# Patient Record
Sex: Male | Born: 1980 | Race: White | Hispanic: No | Smoking: Current every day smoker
Health system: Southern US, Community
[De-identification: ages and names within clinical notes are randomized; demographics above are authoritative.]

## PROBLEM LIST (undated history)

## (undated) DIAGNOSIS — F17200 Nicotine dependence, unspecified, uncomplicated: Secondary | ICD-10-CM

## (undated) DIAGNOSIS — M25579 Pain in unspecified ankle and joints of unspecified foot: Secondary | ICD-10-CM

## (undated) HISTORY — DX: Nicotine dependence, unspecified, uncomplicated: F17.200

## (undated) HISTORY — PX: JOINT REPLACEMENT: SHX530

## (undated) HISTORY — DX: Pain in unspecified ankle and joints of unspecified foot: M25.579

---
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
## 2007-07-18 HISTORY — PX: OTHER SURGICAL HISTORY: SHX169

## 2007-07-24 ENCOUNTER — Encounter (INDEPENDENT_AMBULATORY_CARE_PROVIDER_SITE_OTHER): Payer: Self-pay | Admitting: Orthopedic Surgery

## 2007-07-24 ENCOUNTER — Ambulatory Visit (HOSPITAL_BASED_OUTPATIENT_CLINIC_OR_DEPARTMENT_OTHER): Admission: RE | Admit: 2007-07-24 | Discharge: 2007-07-24 | Payer: Self-pay | Admitting: Orthopedic Surgery

## 2007-10-23 ENCOUNTER — Ambulatory Visit: Payer: Self-pay | Admitting: Family Medicine

## 2007-11-03 ENCOUNTER — Telehealth: Payer: Self-pay | Admitting: Family Medicine

## 2008-07-12 ENCOUNTER — Ambulatory Visit: Payer: Self-pay | Admitting: Family Medicine

## 2008-07-12 DIAGNOSIS — R634 Abnormal weight loss: Secondary | ICD-10-CM | POA: Insufficient documentation

## 2008-07-12 DIAGNOSIS — R51 Headache: Secondary | ICD-10-CM | POA: Insufficient documentation

## 2008-07-12 DIAGNOSIS — R519 Headache, unspecified: Secondary | ICD-10-CM | POA: Insufficient documentation

## 2009-04-26 ENCOUNTER — Ambulatory Visit: Payer: Self-pay | Admitting: Family Medicine

## 2009-04-26 DIAGNOSIS — R079 Chest pain, unspecified: Secondary | ICD-10-CM | POA: Insufficient documentation

## 2009-04-26 LAB — CONVERTED CEMR LAB
Basophils Relative: 0 % (ref 0–1)
Blood in Urine, dipstick: NEGATIVE
Eosinophils Absolute: 0.1 10*3/uL (ref 0.0–0.7)
Eosinophils Relative: 1 % (ref 0–5)
Glucose, Urine, Semiquant: NEGATIVE
HCT: 44.3 % (ref 39.0–52.0)
Lymphs Abs: 1.6 10*3/uL (ref 0.7–4.0)
MCHC: 35.4 g/dL (ref 30.0–36.0)
MCV: 90.5 fL (ref 78.0–100.0)
Monocytes Relative: 7 % (ref 3–12)
Nitrite: NEGATIVE
Platelets: 171 10*3/uL (ref 150–400)
Specific Gravity, Urine: 1.015
WBC Urine, dipstick: NEGATIVE
WBC: 11.6 10*3/uL — ABNORMAL HIGH (ref 4.0–10.5)
pH: 7

## 2009-07-14 ENCOUNTER — Ambulatory Visit: Payer: Self-pay | Admitting: Family Medicine

## 2010-02-23 ENCOUNTER — Ambulatory Visit: Payer: Self-pay | Admitting: Family Medicine

## 2010-07-26 ENCOUNTER — Ambulatory Visit: Payer: Self-pay | Admitting: Family Medicine

## 2010-07-26 DIAGNOSIS — H6120 Impacted cerumen, unspecified ear: Secondary | ICD-10-CM | POA: Insufficient documentation

## 2010-07-26 DIAGNOSIS — M6740 Ganglion, unspecified site: Secondary | ICD-10-CM | POA: Insufficient documentation

## 2010-07-26 DIAGNOSIS — I498 Other specified cardiac arrhythmias: Secondary | ICD-10-CM | POA: Insufficient documentation

## 2010-09-04 ENCOUNTER — Ambulatory Visit: Payer: Self-pay | Admitting: Emergency Medicine

## 2010-09-04 DIAGNOSIS — M25579 Pain in unspecified ankle and joints of unspecified foot: Secondary | ICD-10-CM | POA: Insufficient documentation

## 2010-09-04 HISTORY — DX: Pain in unspecified ankle and joints of unspecified foot: M25.579

## 2010-12-06 ENCOUNTER — Ambulatory Visit: Payer: Self-pay | Admitting: Emergency Medicine

## 2011-01-17 NOTE — Assessment & Plan Note (Signed)
Summary: RIGHT ANKLE PAIN   Vital Signs:  Patient Profile:   30 Years Old Male CC:      right ankle pain post injury last night Height:     74 inches Weight:      180 pounds O2 Sat:      99 % O2 treatment:    Room Air Temp:     97.6 degrees F oral Pulse rate:   86 / minute Resp:     14 per minute BP sitting:   116 / 78  (left arm) Cuff size:   large  Pt. in pain?   yes    Location:   right ankle    Intensity:   8    Type:       sharp/throbbing  Vitals Entered By: Lajean Saver RN (September 04, 2010 10:38 AM)                   Updated Prior Medication List: No Medications Current Allergies (reviewed today): No known allergies History of Present Illness History from: patient Chief Complaint: right ankle pain post injury last night History of Present Illness: R ankle pain after rolling it last night.  He was playing with his children.  Was able to walk afterwards.  Today hurts worse, mild swelling, pain is constant dull.  Not using OTC meds. Has a history of R ankle pain from a few years ago and "ligament damage" from a similar injury.  REVIEW OF SYSTEMS Constitutional Symptoms      Denies fever, chills, night sweats, weight loss, weight gain, and fatigue.  Eyes       Denies change in vision, eye pain, eye discharge, glasses, contact lenses, and eye surgery. Ear/Nose/Throat/Mouth       Denies hearing loss/aids, change in hearing, ear pain, ear discharge, dizziness, frequent runny nose, frequent nose bleeds, sinus problems, sore throat, hoarseness, and tooth pain or bleeding.  Respiratory       Denies dry cough, productive cough, wheezing, shortness of breath, asthma, bronchitis, and emphysema/COPD.  Cardiovascular       Denies murmurs, chest pain, and tires easily with exhertion.    Gastrointestinal       Denies stomach pain, nausea/vomiting, diarrhea, constipation, blood in bowel movements, and indigestion. Genitourniary       Denies painful urination, kidney  stones, and loss of urinary control. Neurological       Denies paralysis, seizures, and fainting/blackouts. Musculoskeletal       Complains of muscle pain, joint pain, joint stiffness, decreased range of motion, and swelling.      Denies redness, muscle weakness, and gout.      Comments: right ankle Skin       Denies bruising, unusual mles/lumps or sores, and hair/skin or nail changes.  Psych       Denies mood changes, temper/anger issues, anxiety/stress, speech problems, depression, and sleep problems. Other Comments: Patient was chasing his kids yesterday and rolled his right ankle hearing a "pop". He c/o sharp shooting painsin his right ankle and up his right leg    Past History:  Past Medical History: Reviewed history from 10/23/2007 and no changes required. None  Past Surgical History: Reviewed history from 02/23/2010 and no changes required. Right knee bone spurs 05/1999 Lef hand 8/08  Family History: Reviewed history from 10/23/2007 and no changes required. GF with Lung Ca GM with DM Father with HTN  Social History: Reviewed history from 02/23/2010 and no changes required.  Married to [employer] with 3 children.  Current Smoker-1 ppd x12  Drug use-no Regular exercise-yes Alcohol use-no Physical Exam General appearance: well developed, well nourished, no acute distress Extremities: R ankle: +TTP ATFL, mildly CFL.  No TTP med/lat mall, nav, base of 5, calc, Achiles. Mild swelling laterally, no ecchymoses Neurological: grossly intact and non-focal Skin: no obvious rashes or lesions MSE: oriented to time, place, and person Assessment New Problems: ANKLE PAIN, RIGHT (ICD-719.47)  XRay: lateral talar avulsion fracture   Patient Education: Patient and/or caregiver instructed in the following: rest, Tylenol prn, Ibuprofen prn.  Plan New Orders: Est. Patient Level III [99213] T-DG Ankle Complete*R* [73610] Ambulatory Surgical Boot ea [L3260] Planning  Comments:   Rest, ice, elevation Walking boot to be worn. Follow up with sports medicine or orthopedics next week.  Ph# given.   The patient and/or caregiver has been counseled thoroughly with regard to medications prescribed including dosage, schedule, interactions, rationale for use, and possible side effects and they verbalize understanding.  Diagnoses and expected course of recovery discussed and will return if not improved as expected or if the condition worsens. Patient and/or caregiver verbalized understanding.   Orders Added: 1)  Est. Patient Level III [56213] 2)  T-DG Ankle Complete*R* [73610] 3)  Ambulatory Surgical Boot ea [L3260]  Appended Document: RIGHT ANKLE PAIN Patient reports that the boot makes his ankle feel worse and that OTC Ibuprofen isn't enough for the pain.  He was advised to bring the boot back, continue NWB with the crutches, and I will write a Rx for Vicodin #20.  He is still to follow up with sports medicine as scheduled next week.  Appended Document: RIGHT ANKLE PAIN Actually the patient will keep the boot because it was only uncomfortable at night.  I told him to remove it when he goes to bed or if sitting down for a prolonged period of time.  Otherwise, he is doing well.

## 2011-01-17 NOTE — Letter (Signed)
Summary: REFERRAL TO ORTHOPEDIC  REFERRAL TO ORTHOPEDIC   Imported By: Dannette Barbara 09/04/2010 12:03:44  _____________________________________________________________________  External Attachment:    Type:   Image     Comment:   External Document

## 2011-01-17 NOTE — Letter (Signed)
Summary: Out of Work  [facility]  [address]   [place], [place] [address]   Phone: [phone]  Fax: [phone]    September 04, 2010   Employee:  Raymond Stewart [employer]    To Whom It May Concern:   For Medical reasons, please excuse the above named employee from work for the following dates:  Sept 19, 2011   If you need additional information, please feel free to contact our office.         Sincerely,    Hoyt Koch MD

## 2011-01-17 NOTE — Letter (Signed)
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   [place], [place] [address]   Phone: [phone]  Fax: [phone]    Today's Date: September 04, 2010  Name of Patient: Raymond Stewart [facility]  The above named patient had a medical visit today at: 11 am.  Please take this into consideration when reviewing the time away from work/school.    Special Instructions:  [  ] None  [  ] To be off the remainder of today, returning to the normal work / school schedule tomorrow.  [  ] To be off until the next scheduled appointment on ______________________.  [ X ] Other: Saud to be out of work today and should be allowed to do mostly seated work for the rest of the week.  Afterwards, he may return to normal duty, however he will need to wear his walking boot.  He will be seeing a sports medicine doctor next week who may change the restrictions at that time.   Sincerely yours,   Hoyt Koch MD

## 2011-01-17 NOTE — Assessment & Plan Note (Signed)
Summary: Lt hand has a knot, Rt ear issue, bradycardia   Vital Signs:  Patient profile:   30 year old male Height:      74 inches Weight:      180 pounds BMI:     23.19 Pulse rate:   77 / minute BP sitting:   121 / 63  (left arm) Cuff size:   regular  Vitals Entered By: Avon Gully CMA, Duncan Dull) (July 26, 2010 9:43 AM) CC: Knot on left hand x one week, sometimes hurts and pain radiates to fingers, cant hear out of rt ear x 3 months,no pain,heart beats slower when laying down   Primary Care Provider:  Linford Arnold, C  CC:  Knot on left hand x one week, sometimes hurts and pain radiates to fingers, cant hear out of rt ear x 3 months, no pain, and heart beats slower when laying down.  History of Present Illness: Knot on left hand x one week, sometimes hurts and pain radiates to fingers, cant hear out of rt ear x 3 months,no pain,heart beats slower when laying down.  Knot on the back of left hand.  Can feel a discomfort occassionally. It has not changed but noticed it about a week ago.    Right ear with hearing loss for 3 month. 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 It was gradual.  No pain or drainage. No problems with the left ear. Not sue if any infection when it started.   Usually when goes to bed and lays on his back says his heart feels really heavy and slow.  He will try to take deep breaths to speed his heart back up. Lasts maybe 1-2 min.  Occ scares him. No chest pain or nausea with it. + family hx heart dz, but not premature.  hasn't checked pulse when it happens. Happens occ but has been more frequent. Now about once a week. Doesn't happen with naps. Does do shift work.  No problem with swellibg in the extremities.   Current Medications (verified): 1)  None  Allergies (verified): No Known Drug Allergies  Comments:  Nurse/Medical Assistant: The patient's medications and allergies were reviewed with the patient and were updated in the Medication  and Allergy Lists. Avon Gully CMA, Duncan Dull) (July 26, 2010 9:45 AM)  Physical Exam  General:  Well-developed,well-nourished,in no acute distress; alert,appropriate and cooperative throughout examination Head:  Normocephalic and atraumatic without obvious abnormalities. No apparent alopecia or balding. Eyes:  No corneal or conjunctival inflammation noted. EOMI. Perrla.  Ears:  Both ears bocked by cerumen.  TM and canal clear after irrigation.  Nose:  External nasal examination shows no deformity or inflammation. Nasal mucosa are pink and moist without lesions or exudates. Mouth:  Oral mucosa and oropharynx without lesions or exudates.  Teeth in good repair. Lungs:  Normal respiratory effort, chest expands symmetrically. Lungs are clear to auscultation, no crackles or wheezes. Heart:  Normal rate and regular rhythm. S1 and S2 normal without gallop, murmur, click, rub or other extra sounds. Abdomen:  Bowel sounds positive,abdomen soft and non-tender without masses, organomegaly or hernias noted. Msk:  posterior left hand with a small 0.2-0.3 palpable cyst. Nontender.   Extremities:  No LE edema.  Skin:  no rashes.   Psych:  Cognition and judgment appear intact. Alert and cooperative with normal attention span and concentration. No apparent delusions, illusions, hallucinations   Impression & Recommendations:  Problem # 1:  CERUMEN IMPACTION (ICD-380.4) Irrigated and pt  could instantly hear. Repeat ear exam was normal.    Problem # 2:  BRADYCARDIA (ICD-427.89)  EKG shows rate of 65 bpm, no acute changes.No evid of heart block.  Recomend set up for holter monitor.  Discussed options including getting a Holter monitor to try to capture the events. It does happen about once a week.   Can also consider referral to cardiology for further evaluation. He prefers cards referral.   Orders: EKG w/ Interpretation (93000)  Problem # 3:  GANGLION CYST (ICD-727.43) GAve reassuracne that these  are benign. If becomes tender and painful can refer to hand surgeon for removal.  If get larger then we can try to aspirate it for treatment.   Patient Instructions: 1)  We will call you with the cardiology referral.

## 2011-01-17 NOTE — Letter (Signed)
Summary: MED PRESCRIPTION PLOICY  MED PRESCRIPTION PLOICY   Imported By: Dannette Barbara 09/04/2010 12:02:54  _____________________________________________________________________  External Attachment:    Type:   Image     Comment:   External Document

## 2011-01-17 NOTE — Assessment & Plan Note (Signed)
Summary: PAIN IN RECTAL AREA/TJ   Vital Signs:  Patient Profile:   30 Years Old Male CC:      intermittent sharp rectal pain after intercourse Height:     74 inches Weight:      173 pounds O2 Sat:      99 % O2 treatment:    Room Air Temp:     97.2 degrees F oral Pulse rate:   75 / minute Pulse rhythm:   regular Resp:     18 per minute BP sitting:   111 / 71  (right arm)  Pt. in pain?   yes    Location:   rectum    Intensity:   10    Type:       sharp  Vitals Entered By: Lajean Saver RN (February 23, 2010 9:43 AM)                   Updated Prior Medication List: No Medications Current Allergies (reviewed today): No known allergies History of Present Illness Chief Complaint: intermittent sharp rectal pain after intercourse History of Present Illness: PATIENT STATES THIS HAS HAPPENED INTERMITTANTLY OVER THE PAST 2-3 MONTH. DOES NOT INGAGE IN ANAL SEX OF ANY SORT. NO SWELLING. PAIN LAST APPROX 15 MIN AND RESOLVES. DENIES URETHRAL DISCHARGE. NO HX OF PRIOR PROBLMES. MARRIED. NO DYSURIA.   REVIEW OF SYSTEMS Constitutional Symptoms      Denies fever, chills, night sweats, weight loss, weight gain, and fatigue.  Eyes       Denies change in vision, eye pain, eye discharge, glasses, contact lenses, and eye surgery. Ear/Nose/Throat/Mouth       Denies hearing loss/aids, change in hearing, ear pain, ear discharge, dizziness, frequent runny nose, frequent nose bleeds, sinus problems, sore throat, hoarseness, and tooth pain or bleeding.  Respiratory       Denies dry cough, productive cough, wheezing, shortness of breath, asthma, bronchitis, and emphysema/COPD.  Cardiovascular       Denies murmurs, chest pain, and tires easily with exhertion.    Gastrointestinal       Denies stomach pain, nausea/vomiting, diarrhea, constipation, blood in bowel movements, and indigestion.      Comments: rectal pain Genitourniary       Denies painful urination, kidney stones, and loss of urinary  control. Neurological       Denies paralysis, seizures, and fainting/blackouts. Musculoskeletal       Denies muscle pain, joint pain, joint stiffness, decreased range of motion, redness, swelling, muscle weakness, and gout.  Skin       Denies bruising, unusual mles/lumps or sores, and hair/skin or nail changes.  Psych       Denies mood changes, temper/anger issues, anxiety/stress, speech problems, depression, and sleep problems. Other Comments: occurs after intercourse, chills accompany pain, and sometimes dizziness,  patientdenies bleeding, and denies rectal intercourse   Past History:  Past Medical History: Reviewed history from 10/23/2007 and no changes required. None  Past Surgical History: Right knee bone spurs 05/1999 Lef hand 8/08  Family History: Reviewed history from 10/23/2007 and no changes required. GF with Lung Ca GM with DM Father with HTN  Social History:  Married to [employer] with 3 children.  Current Smoker-1 ppd x12  Drug use-no Regular exercise-yes Alcohol use-no Physical Exam General appearance: well developed, well nourished, no acute distress GU: normal, NO DISCHARGE, RECTAL REVEALS NO SWELLING OR HEMORRHOIDAL TISSUE. PROSTATE ENLARGED WARM AND TENDER. ABLE TO REPRODUCE PAIN WITH PROSTATE PALPATION.  Assessment New Problems:  ACUTE PROSTATITIS (ICD-601.0)   Plan New Medications/Changes: CIPRO 500 MG TABS (CIPROFLOXACIN HCL) 1 by mouth two times a day  ##30 x 0, 02/23/2010, Marvis Moeller DO  New Orders: Est. Patient Level III [16109]   Prescriptions: CIPRO 500 MG TABS (CIPROFLOXACIN HCL) 1 by mouth two times a day  ##30 x 0   Entered and Authorized by:   Marvis Moeller DO   Signed by:   Marvis Moeller DO on 02/23/2010   Method used:   Print then Give to Patient   RxID:   [id number]   Patient Instructions: 1)  AVOID CAFFEINE. C0MPLETE ALL OF MEDICATON. IF PROBLEM PERSISTS OR RECURS RECOMMEND UROLOGY EVAL.

## 2011-01-18 NOTE — Assessment & Plan Note (Signed)
Summary: RT ANKLE NUMBNESS,TINGLING/WSE   Vital Signs:  Patient Profile:   30 Years Old Male CC:      PT not seen referred to orthopedic for an appt/cl,lpn O2 treatment:    Room Air (left arm) Cuff size:   regular  Vitals Entered By: Clemens Catholic LPN (December 06, 2010 12:07 PM)                  Current Allergies: No known allergies History of Present Illness Chief Complaint: ****PT not seen referred to orthopedic for an appt/cl,lpn*** History of Present Illness: Patient not seen, referred back to his orthopedic surgeon tomorrow.  Has a history 3 months ago for being seen for left foot talar avulsion fx.  Now with continued pain.  No new injury.      The patient and/or caregiver has been counseled thoroughly with regard to medications prescribed including dosage, schedule, interactions, rationale for use, and possible side effects and they verbalize understanding.  Diagnoses and expected course of recovery discussed and will return if not improved as expected or if the condition worsens. Patient and/or caregiver verbalized understanding.   Orders Added: 1)  No Charge Patient Arrived (NCPA0) [NCPA0]

## 2011-04-04 ENCOUNTER — Encounter: Payer: Self-pay | Admitting: Family Medicine

## 2011-04-06 ENCOUNTER — Encounter: Payer: Self-pay | Admitting: Family Medicine

## 2011-04-06 ENCOUNTER — Ambulatory Visit (INDEPENDENT_AMBULATORY_CARE_PROVIDER_SITE_OTHER): Payer: Private Health Insurance - Indemnity | Admitting: Family Medicine

## 2011-04-06 VITALS — BP 127/80 | HR 70 | Ht 74.0 in | Wt 185.0 lb

## 2011-04-06 DIAGNOSIS — R41840 Attention and concentration deficit: Secondary | ICD-10-CM

## 2011-04-06 DIAGNOSIS — J329 Chronic sinusitis, unspecified: Secondary | ICD-10-CM

## 2011-04-06 DIAGNOSIS — R413 Other amnesia: Secondary | ICD-10-CM

## 2011-04-06 DIAGNOSIS — R21 Rash and other nonspecific skin eruption: Secondary | ICD-10-CM

## 2011-04-06 MED ORDER — SELENIUM SULFIDE 2.5 % EX LOTN: TOPICAL_LOTION | CUTANEOUS | Status: DC

## 2011-04-06 MED ORDER — AMOXICILLIN-POT CLAVULANATE 875-125 MG PO TABS: 1.0000 | ORAL_TABLET | Freq: Two times a day (BID) | ORAL | Status: AC

## 2011-04-06 NOTE — Patient Instructions (Signed)
We will call you with the psychiatry referral Call if you sinuses are not better in 10 days.

## 2011-04-06 NOTE — Progress Notes (Signed)
Subjective:    Patient ID: Raymond Stewart, male    DOB: 1981-11-10, 30 y.o.   MRN: [id number]  Sinusitis This is a recurrent problem. The current episode started 1 to 4 weeks ago (10 days). The problem has been gradually worsening since onset. There has been no fever. Associated symptoms include congestion, coughing and sneezing. Pertinent negatives include no ear pain, headaches, shortness of breath, sinus pressure, sore throat or swollen glands. (Bloody nasal discharge. ) Past treatments include oral decongestants. The treatment provided mild relief.  Rash This is a chronic problem. The current episode started more than 1 year ago. The problem has been gradually worsening since onset. Pain location: belly button. The rash is characterized by dryness (no itching but has been getting larger). He was exposed to nothing. Associated symptoms include congestion and coughing. Pertinent negatives include no shortness of breath or sore throat. Past treatments include nothing. There is no history of asthma or eczema.   Wife feels she has an attention problem. Not completed tasks. Easily distracted.  Also having some mood swings, has been irritable.  Happening at work and home. 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  Wife is really concerned Sometimes will get anxious.  Feels restless.  Never been tested for ADHD.  Wakes up frequently with sleep.  Sleep is very irregularly.    He felt like he was a normal kid. Didn't feel he had difficulty focusing        Review of Systems  HENT: Positive for congestion and sneezing. Negative for ear pain, sore throat and sinus pressure.   Respiratory: Positive for cough. Negative for shortness of breath.   Skin: Positive for rash.  Neurological: Negative for headaches.       Objective:   Physical Exam  Constitutional: He is oriented to person, place, and time. He appears well-developed and well-nourished.  HENT:  Head: Normocephalic and atraumatic.  Right Ear:  External ear normal.  Left Ear: External ear normal.  Nose: Nose normal.  Mouth/Throat: Oropharynx is clear and moist.  Eyes: Conjunctivae and EOM are normal. Pupils are equal, round, and reactive to light.  Neck: Normal range of motion. Neck supple. No thyromegaly present.  Cardiovascular: Normal rate, regular rhythm and normal heart sounds.   Pulmonary/Chest: Effort normal and breath sounds normal.  Lymphadenopathy:    He has no cervical adenopathy.  Neurological: He is alert and oriented to person, place, and time.  Skin: Skin is warm and dry. Rash noted.       On his left buttock has pink oval macules that have A fine scale.  Also similar but much larger area over the umbilicus. Scarping performed to rule out fungus.           Assessment & Plan:  Rash - Likely tinea versicolor I did do a scraping for KOH on the lesion on his abdomen. The area on his buttocks appears to be extremely consistent with tinea versicolor. Head and center of her prescription for some Lamisil 5 lotion to use. I will also call him with results and KOH once we have that back in a couple of days.  Sinusitis - I will go ahead and treat him as bacterial sinusitis. Please complete the antibiotic. Symptomatic care is appropriate. Call if not better in 10 days.  Mood/inattention. - Me this is not a clear-cut case of ADD or ADHD. Certainly he has sleep issues and he works a swing set shift which is certainly can contribute to inability  to focus and mood changes. I like him to see psychiatry for further evaluation. Patient agrees to care plan.

## 2011-04-08 ENCOUNTER — Telehealth: Payer: Self-pay | Admitting: Family Medicine

## 2011-04-08 LAB — KOH PREP

## 2011-04-08 NOTE — Telephone Encounter (Signed)
The scraping is fungal. Thus likely Tinea versicolor. Use the selenium shampoo and let me know if not better in one month.

## 2011-04-09 NOTE — Telephone Encounter (Signed)
Pt notified via vm

## 2011-05-01 NOTE — Op Note (Signed)
Raymond Stewart, Raymond Stewart             ACCOUNT NO.:  [id number]   MEDICAL RECORD NO.:  [id number]          PATIENT TYPE:  AMB   LOCATION:  DSC                          FACILITY:  [facility]   PHYSICIAN:  Katy Fitch. Sypher, M.D. DATE OF BIRTH:  28-Feb-1981   DATE OF PROCEDURE:  07/24/2007  DATE OF DISCHARGE:                               OPERATIVE REPORT   PREOPERATIVE DIAGNOSIS:  Chronic left long finger flexor tenosynovitis.   POSTOPERATIVE DIAGNOSIS:  Chronic left long finger flexor tenosynovitis,  with identification of chronic inflammatory tenosynovitis involving the  superficialis and profundus tendons from proximal to the A1 pulley in  the palm distal to the A3 pulley.   POSTOPERATIVE DIAGNOSIS:  Rule out mycobacterial infection versus  foreign body synovitis.   OPERATION:  Radical tenosynovectomy of flexor digitorum superficialis  and profundus tendons, left long finger, with specimens of tenosynovium  sent for aerobic and anaerobic growth, acid-fast and fungal smear and  culture, and routine histopathology.   OPERATING SURGEON:  Katy Fitch. Sypher, MD   ASSISTANT:  Annye Rusk PA-C.   ANESTHESIA:  General by LMA, supervising anesthesiologist is Janetta Hora.  Gelene Mink, MD.   INDICATIONS:  Raymond Stewart is an 30 year old gentleman referred for  evaluation of chronic flexor tenosynovitis involving his left palm and  long finger.   He had had a chronic pain and inability to flex the finger for more than  6 weeks.   Clinical examination revealed a convex bulging of the palm proximal to  the A1 pulley suggestive of an aggressive synovitis and a flexion  contracture of the left long finger due to loss of excursion of the  superficialis and profundus tendons.   Raymond Stewart was sent for an MRI of his hand due to the degree of  swelling to be certain that this did not represent a tumor that was  infiltrating the flexor sheath.  The MRI evaluation suggested a chronic  synovitis.   Arrangements were made for him to come to the operating room  at this time anticipating tenosynovectomy for culture, biopsy, acid-fast  and fungal smear, and culture.   After informed consent,  he is brought to the operating room.   Raymond Stewart was brought to the operating room and placed in a supine  position upon the operating table.   Following induction of general anesthesia by LMA technique, the left arm  was prepped with Betadine soap and solution and sterilely draped.  A  pneumatic tourniquet was applied to the proximal left brachium.   Following exsanguination of the left arm with an Esmarch bandage, the  arterial tourniquet was inflated to 220 mmHg.   A Brunner zigzag incision was planned with a webspace dart.   The flexor sheath was exposed and noted be markedly inflammatory with  fibrin and inflammatory granulation tissue extruding through the flexor  sheath between the A1 pulley and the A3 pulley.   The preputial fold proximal to the A1 pulley was thoroughly infiltrated  with inflammatory tenosynovium.  This was excised completely and passed  off for routine pathology as well as aerobic and anaerobic culture.  A window was created between the C1 pulley and the A3 pulley and the  flexor tendons were delivered distally.  The superficialis and profundus  tendons were stripped clean of all pathologic tenosynovium.   There was a significant amount of tenosynovium posterior to the  profundus tendon deep to the A2 pulley that was removed with a micro  rongeur.   The flexor tendons were then delivered proximal to the A1 pulley and  between the A1 pulley and the A2 pulley for thorough tenosynovectomy.   After complete clearing of all pathologic-appearing synovium from the  superficialis and profundus tendons, specimens were sent for aerobic and  anaerobic growth in the proper culture media tubes, acid-fast and fungal  smear and culture in saline in a sterile cup, and  some synovium was  placed directly into formalin for histopathologic evaluation.   This had the appearance of a foreign body reaction verses a chronic  mycobacterial infection on a gross clinical basis.   The wounds were then irrigated and repaired with corner sutures of 5-0  nylon and intradermal 3-0 Prolene in the longitudinal limbs of the  Brunner incision.   Raymond Stewart was placed in a compressive dressing with a volar plaster  splint maintaining the wrist in 30 degrees of dorsiflexion.   We will initiate gentle active range of motion excises immediately.   There were no apparent complications.      Katy Fitch Sypher, M.D.  Electronically Signed     RVS/MEDQ  D:  07/24/2007  T:  07/25/2007  Job:  [id number]

## 2011-08-22 ENCOUNTER — Inpatient Hospital Stay (INDEPENDENT_AMBULATORY_CARE_PROVIDER_SITE_OTHER)
Admission: RE | Admit: 2011-08-22 | Discharge: 2011-08-22 | Disposition: A | Discharge status: Home / Self Care | Payer: Private Health Insurance - Indemnity | Source: Ambulatory Visit | Attending: Emergency Medicine | Admitting: Emergency Medicine

## 2011-08-22 ENCOUNTER — Encounter: Payer: Self-pay | Admitting: Emergency Medicine

## 2011-08-22 DIAGNOSIS — M25559 Pain in unspecified hip: Secondary | ICD-10-CM

## 2011-08-23 ENCOUNTER — Telehealth (INDEPENDENT_AMBULATORY_CARE_PROVIDER_SITE_OTHER): Payer: Self-pay | Admitting: *Deleted

## 2011-08-24 ENCOUNTER — Telehealth (INDEPENDENT_AMBULATORY_CARE_PROVIDER_SITE_OTHER): Payer: Self-pay | Admitting: Emergency Medicine

## 2011-10-01 LAB — TISSUE CULTURE
Culture: NO GROWTH
Gram Stain: NONE SEEN

## 2011-10-01 LAB — POCT HEMOGLOBIN-HEMACUE
Hemoglobin: 15.9
Operator id: 112821

## 2011-10-01 LAB — ANAEROBIC CULTURE

## 2011-10-01 LAB — FUNGUS CULTURE W SMEAR

## 2011-10-01 LAB — AFB CULTURE WITH SMEAR (NOT AT ARMC)

## 2011-11-19 NOTE — Progress Notes (Signed)
Summary: left side leg pain   Vital Signs:  Patient Profile:   30 Years Old Male CC:      LT leg pain x 1 1/2 wk Height:     74 inches Weight:      191 pounds O2 Sat:      99 % O2 treatment:    Room Air Temp:     98.3 degrees F oral Pulse rate:   67 / minute Resp:     20 per minute BP sitting:   119 / 69  (left arm) Cuff size:   regular  Vitals Entered By: Clemens Catholic LPN (August 22, 2011 11:54 AM)                  Updated Prior Medication List: No Medications Current Allergies (reviewed today): No known allergies History of Present Illness Chief Complaint: LT leg pain x 1 1/2 wk History of Present Illness: L leg / buttock pain for 10 days.  No trauma.  He has a h/o trochanteric bursitis and a possible herniated disc.  This pain is different because it's in his buttock. Hurts worse while rising from seated or seated for a prolonged period of time.  Not using any meds or modalities.  Trauma: no Bladder/bowel incontinence: no Weakness: no Fever/chills: no Night pain: no Unexplained weight loss: no Cancer/immunosuppression: no PMH of osteoporosis or chronic steroid use:  no  REVIEW OF SYSTEMS Constitutional Symptoms      Denies fever, chills, night sweats, weight loss, weight gain, and fatigue.  Eyes       Denies change in vision, eye pain, eye discharge, glasses, contact lenses, and eye surgery. Ear/Nose/Throat/Mouth       Denies hearing loss/aids, change in hearing, ear pain, ear discharge, dizziness, frequent runny nose, frequent nose bleeds, sinus problems, sore throat, hoarseness, and tooth pain or bleeding.  Respiratory       Denies dry cough, productive cough, wheezing, shortness of breath, asthma, bronchitis, and emphysema/COPD.  Cardiovascular       Denies murmurs, chest pain, and tires easily with exhertion.    Gastrointestinal       Denies stomach pain, nausea/vomiting, diarrhea, constipation, blood in bowel movements, and  indigestion. Genitourniary       Denies painful urination, kidney stones, and loss of urinary control. Neurological       Denies paralysis, seizures, and fainting/blackouts. Musculoskeletal       Complains of muscle pain and joint pain.      Denies joint stiffness, decreased range of motion, redness, swelling, muscle weakness, and gout.  Skin       Denies bruising, unusual mles/lumps or sores, and hair/skin or nail changes.  Psych       Denies mood changes, temper/anger issues, anxiety/stress, speech problems, depression, and sleep problems. Other Comments: pt c/o LT hip/leg pain x 1 1/2 wk. no injury. no OTC meds. he has a hx of bursitis in his LT hip.   Past History:  Past Medical History: Reviewed history from 10/23/2007 and no changes required. None  Past Surgical History: Reviewed history from 02/23/2010 and no changes required. Right knee bone spurs 05/1999 Lef hand 8/08  Family History: Reviewed history from 10/23/2007 and no changes required. GF with Lung Ca GM with DM Father with HTN  Social History: Reviewed history from 02/23/2010 and no changes required.  Married to [employer] with 3 children.  Current Smoker-1 ppd x12  Drug use-no Regular exercise-yes Alcohol use-no Physical Exam General  appearance: well developed, well nourished, no acute distress MSE: oriented to time, place, and person Minimal L greater trochanter tenderness. SLR and knee flexion causes pain in buttock.  Standing on R leg reproduces pain.  No back pain or tenderness.  Log roll normal.  FROM of hip and knee. Assessment New Problems: HIP PAIN (ICD-719.45)   Plan New Medications/Changes: PREDNISONE (PAK) 10 MG TABS (PREDNISONE) 6 day pack, use as directed  #1 x 0, 08/22/2011, Hoyt Koch MD TRAMADOL HCL 50 MG TABS (TRAMADOL HCL) 1 by mouth q6 hrs as needed for pain  #24 x 0, 08/22/2011, Hoyt Koch MD  New Orders: Est. Patient Level III [phone] Planning Comments:    LIkely is piriformis syndrome or similar variant.  DDx includes trochanteric bursitis radiation pain, pain from herniated disc, glut medius strain.  Pred taper and Tramadol given.  Heating pad. Stretches described.  If not improving, will send him back to [place] vs straight to PT.  He will call if worsening or not improving.   The patient and/or caregiver has been counseled thoroughly with regard to medications prescribed including dosage, schedule, interactions, rationale for use, and possible side effects and they verbalize understanding.  Diagnoses and expected course of recovery discussed and will return if not improved as expected or if the condition worsens. Patient and/or caregiver verbalized understanding.  Prescriptions: PREDNISONE (PAK) 10 MG TABS (PREDNISONE) 6 day pack, use as directed  #1 x 0   Entered and Authorized by:   Hoyt Koch MD   Signed by:   Hoyt Koch MD on 08/22/2011   Method used:   Print then Give to Patient   RxID:   [id number] TRAMADOL HCL 50 MG TABS (TRAMADOL HCL) 1 by mouth q6 hrs as needed for pain  #24 x 0   Entered and Authorized by:   Hoyt Koch MD   Signed by:   Hoyt Koch MD on 08/22/2011   Method used:   Print then Give to Patient   RxID:   [id number]   Orders Added: 1)  Est. Patient Level III [29528]

## 2011-11-19 NOTE — Telephone Encounter (Signed)
  Phone Note Call from Patient   Caller: Patient Summary of Call: pt called and reports that his leg pain is no better, meds not helping. sch'ed appt with dr Margaretha Sheffield today @ 2pm in [place]. pt notified and records sent to [facility]. Initial call taken by: Clemens Catholic LPN,  August 23, 2011 11:09 AM

## 2011-11-19 NOTE — Telephone Encounter (Signed)
  Phone Note Call from Patient Call back at Home Phone [phone] [facility]     Caller: Patient Call For: Dr. Orson Aloe Summary of Call: Pt. states not getting relief from Rx. Tramadol>>leads to nausea; has just purchased heating pad; cannot sleep at night; pain most intense when walking or lying down. Spoke with Dr.Henderson: call Rx for Vicodin 4/500 1 tab HS as needed x 6 with no refills. Pt. understands. Initial call taken by: Lavell Islam RN,  August 24, 2011 10:38 AM

## 2012-09-04 ENCOUNTER — Ambulatory Visit (INDEPENDENT_AMBULATORY_CARE_PROVIDER_SITE_OTHER): Payer: Private Health Insurance - Indemnity | Admitting: Sports Medicine

## 2012-09-04 ENCOUNTER — Encounter: Payer: Self-pay | Admitting: Sports Medicine

## 2012-09-04 VITALS — BP 128/76 | HR 87 | Wt 191.0 lb

## 2012-09-04 DIAGNOSIS — F17200 Nicotine dependence, unspecified, uncomplicated: Secondary | ICD-10-CM

## 2012-09-04 DIAGNOSIS — M25539 Pain in unspecified wrist: Secondary | ICD-10-CM

## 2012-09-04 DIAGNOSIS — M25531 Pain in right wrist: Secondary | ICD-10-CM | POA: Insufficient documentation

## 2012-09-04 HISTORY — DX: Nicotine dependence, unspecified, uncomplicated: F17.200

## 2012-09-04 MED ORDER — VARENICLINE TARTRATE 0.5 MG X 11 & 1 MG X 42 PO MISC: ORAL | Status: DC

## 2012-09-04 MED ORDER — MELOXICAM 15 MG PO TABS: ORAL_TABLET | ORAL | Status: DC

## 2012-09-04 NOTE — Assessment & Plan Note (Signed)
Symptoms are present mostly volar, and the lateral aspect of the carpal tunnel suggestive of a flexor tendinosis. He does get pain referred to the dorsal wrist joint with terminal flexion, and this is suggestive of the radiocarpal degenerative process. We will x-ray his wrist. Simple wrist brace. Mobic. Home rehabilitation exercises. We'll try this for 2-3 weeks, and if no better I can consider ultrasound versus ultrasound-guided injection at the next visit.

## 2012-09-04 NOTE — Assessment & Plan Note (Addendum)
Related to quit. We'll try Chantix, I prescribed this starting month up-taper pack. He may follow up with either his PCP or me further for continued smoking cessation, and for the second month smoking cessation pack of Chantix.

## 2012-09-04 NOTE — Progress Notes (Signed)
Subjective:    I'm seeing this patient as a consultation for:  Dr. Linford Arnold  CC: Right wrist pain, smoking cessation  HPI: Raymond Stewart is a very pleasant 31 year old male comes in with about a two-week history of pain he localizes on the volar aspect at the proximal wrist crease. He notes that the wrist feels somewhat weak, and he tends to drop things. The pain is worse with terminal flexion of the wrist, and does radiate through the proximal wrist crease to the dorsal wrist. He denies any numbness or tingling going into his fingertips, denies any nighttime symptoms.  He also denies any recent trauma. He does work in a job that requires repetitive wrist motion. He denies any swelling of the wrist.  Smoker: Raymond Stewart also desires to quit smoking. He's been smoking just over a pack to a pack and a half a day for a long time now. He's tried hypnosis, as well as quitting cold [place]. Neither of these have worked, and he's interested in pharmacologic intervention.  Past medical history, Surgical history, Family history, Social history, Allergies, and medications have been entered into the medical record, reviewed, and no changes needed.   Review of Systems: No headache, visual changes, nausea, vomiting, diarrhea, constipation, dizziness, abdominal pain, skin rash, fevers, chills, night sweats, weight loss, body aches, joint swelling, muscle aches, chest pain, or shortness of breath.   Objective:   Vitals:  Afebrile, vital signs stable. General: Well Developed, well nourished, and in no acute distress.  Neuro/Psych: Alert and oriented x3, extra-ocular muscles intact, able to move all 4 extremities.  Skin: Warm and dry, no rashes noted.  Respiratory: Not using accessory muscles, speaking in full sentences, trachea midline.  Cardiovascular: Pulses palpable, no extremity edema. Abdomen: Does not appear distended. Right Wrist: Inspection normal with no visible erythema or swelling. ROM smooth and normal with  good flexion and extension and ulnar/radial deviation that is symmetrical with opposite wrist. There is palpable pain deep within the carpal tunnel somewhat radial to the palmaris longus. I cannot reproduce this with resisted flexion, extension, radial, ulnar deviation of the wrist, nor pronation or supination with resistance. Resisted flexion, or extension of each digit individually also does not reproduce this pain. I am able to reproduce it with passive terminal flexion causing pain volarly that radiates through the wrist to the dorsal aspect. No snuffbox tenderness. No tenderness over Canal of Guyon. Strength 5/5 in all directions without pain. Negative Finkelstein, tinel's and phalens. Negative Watson's test. Sensation is grossly intact.   Impression and Recommendations:   This case required medical decision making of moderate complexity.

## 2012-09-23 ENCOUNTER — Ambulatory Visit: Payer: Private Health Insurance - Indemnity | Admitting: Sports Medicine

## 2012-09-23 DIAGNOSIS — Z0289 Encounter for other administrative examinations: Secondary | ICD-10-CM

## 2012-11-05 ENCOUNTER — Encounter: Payer: Self-pay | Admitting: Family Medicine

## 2012-11-05 ENCOUNTER — Ambulatory Visit (INDEPENDENT_AMBULATORY_CARE_PROVIDER_SITE_OTHER): Payer: Self-pay | Admitting: Family Medicine

## 2012-11-05 VITALS — BP 119/71 | HR 76 | Ht 74.0 in | Wt 191.0 lb

## 2012-11-05 DIAGNOSIS — R0789 Other chest pain: Secondary | ICD-10-CM

## 2012-11-05 DIAGNOSIS — J3489 Other specified disorders of nose and nasal sinuses: Secondary | ICD-10-CM

## 2012-11-05 DIAGNOSIS — Z8249 Family history of ischemic heart disease and other diseases of the circulatory system: Secondary | ICD-10-CM

## 2012-11-05 DIAGNOSIS — R0981 Nasal congestion: Secondary | ICD-10-CM

## 2012-11-05 LAB — COMPLETE METABOLIC PANEL WITH GFR
ALT: 9 U/L (ref 0–53)
AST: 15 U/L (ref 0–37)
Albumin: 4.4 g/dL (ref 3.5–5.2)
Alkaline Phosphatase: 87 U/L (ref 39–117)
BUN: 10 mg/dL (ref 6–23)
CO2: 29 mEq/L (ref 19–32)
Calcium: 9.4 mg/dL (ref 8.4–10.5)
Chloride: 103 mEq/L (ref 96–112)
Creat: 0.81 mg/dL (ref 0.50–1.35)
GFR, Est African American: >89 mL/min
GFR, Est Non African American: >89 mL/min
Glucose, Bld: 78 mg/dL (ref 70–99)
Potassium: 3.9 mEq/L (ref 3.5–5.3)
Sodium: 140 mEq/L (ref 135–145)
Total Bilirubin: 1.5 mg/dL — ABNORMAL HIGH (ref 0.3–1.2)
Total Protein: 6.7 g/dL (ref 6.0–8.3)

## 2012-11-05 LAB — CBC
HCT: 41.7 % (ref 39.0–52.0)
Hemoglobin: 14.6 g/dL (ref 13.0–17.0)
MCH: 30.4 pg (ref 26.0–34.0)
MCHC: 35 g/dL (ref 30.0–36.0)
MCV: 86.9 fL (ref 78.0–100.0)
Platelets: 223 10*3/uL (ref 150–400)
RBC: 4.8 MIL/uL (ref 4.22–5.81)
RDW: 13.8 % (ref 11.5–15.5)
WBC: 8.9 10*3/uL (ref 4.0–10.5)

## 2012-11-05 LAB — LIPID PANEL
Cholesterol: 158 mg/dL (ref 0–200)
Triglycerides: 88 mg/dL (ref <150)
VLDL: 18 mg/dL (ref 0–40)

## 2012-11-05 LAB — TSH: TSH: 1.316 u[IU]/mL (ref 0.350–4.500)

## 2012-11-05 MED ORDER — FLUTICASONE PROPIONATE 50 MCG/ACT NA SUSP: 2.0000 | Freq: Every day | NASAL | Status: DC

## 2012-11-05 MED ORDER — OMEPRAZOLE 20 MG PO CPDR: 20.0000 mg | DELAYED_RELEASE_CAPSULE | Freq: Every day | ORAL | Status: DC

## 2012-11-05 NOTE — Progress Notes (Signed)
  Subjective:    Patient ID: Raymond Stewart, male    DOB: 25-Jan-1981, 31 y.o.   MRN: [id number]  HPI Woke up at 11AM in the morning, about 2 weeks ago 10/25/12,  and had sharp chest pains and SOB. The night before had felt a little dizzy. Has had similar sxs before. Then went to ED, [facility] hospital) and told it was likely stress related. Says he didn't feel like he was under any particular stress.  Normally last 2-30 minutes but that one last about 3 hours.  Does work a rotating shift at work so sometimes eat irregularly.  No recent GERD or reflux sxs.  Still smoking but has cut back. Happened again on thrusday but was watching TV at the time. Lasted about an hour with chest pain and SOB.   Father was in early 38s when had his first heartattack.  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   Nasal pressure and congestion for several days. No runny nose. No itchin in the face. No URI sxs.  No fever.  Not on any allergy meds.     Review of Systems     Objective:   Physical Exam  Constitutional: He is oriented to person, place, and time. He appears well-developed and well-nourished.  HENT:  Head: Normocephalic and atraumatic.  Neck: Neck supple. No thyromegaly present.  Cardiovascular: Normal rate, regular rhythm and normal heart sounds.        No carotid or bruits.  Pulmonary/Chest: Effort normal and breath sounds normal.  Musculoskeletal: He exhibits no edema.  Lymphadenopathy:    He has no cervical adenopathy.  Neurological: He is alert and oriented to person, place, and time.  Skin: Skin is warm and dry.  Psychiatric: He has a normal mood and affect. His behavior is normal.          Assessment & Plan:  Atypical chest pain-this his description I do not think that this is cardiac because his pain has occurred at rest both times. There he does have a very strong family history of heart disease with his father having his first heart attack in his early 45s.  Certainly this is a risk factor. His blood pressure is well-controlled. His cholesterol is unknown so we will get that today to better risk stratify him. I think a treadmill stress test would be reasonable though. Also consider that this could be reflux or GERD related. He has not had any specific heartburn type symptoms but I would like to try PPI for the next 2-3 weeks to see if it makes a difference in his symptom control. He says he would try it. Also consider this could be more musculoskeletal but I would expect that his pain would be worse with activity, not at rest. Certainly this could be stress or anxiety related but right now I would like to rule out other causes.  Nasal congestion-we'll start with a nasal steroid spray. This will help the swelling and congestion. He does not get better then consider treating for sinusitis.  I did review his records from the hospital. His cardiac enzymes were normal. He also had a chest x-ray that was normal. EKG was reportedly normal as well. No anemia or electrolyte disturbance. He was told to take Tylenol and ibuprofen as needed.

## 2012-11-05 NOTE — Patient Instructions (Addendum)
We will call you with your lab results. If you don't here from us in about a week then please give us a call at [phone].  

## 2012-11-24 ENCOUNTER — Ambulatory Visit (INDEPENDENT_AMBULATORY_CARE_PROVIDER_SITE_OTHER): Payer: Private Health Insurance - Indemnity | Admitting: Family Medicine

## 2012-11-24 ENCOUNTER — Encounter: Payer: Self-pay | Admitting: Family Medicine

## 2012-11-24 ENCOUNTER — Ambulatory Visit (INDEPENDENT_AMBULATORY_CARE_PROVIDER_SITE_OTHER): Payer: Private Health Insurance - Indemnity

## 2012-11-24 VITALS — BP 133/83 | HR 90 | Ht 74.0 in | Wt 191.0 lb

## 2012-11-24 DIAGNOSIS — M25571 Pain in right ankle and joints of right foot: Secondary | ICD-10-CM

## 2012-11-24 DIAGNOSIS — S82839A Other fracture of upper and lower end of unspecified fibula, initial encounter for closed fracture: Secondary | ICD-10-CM

## 2012-11-24 DIAGNOSIS — S82899A Other fracture of unspecified lower leg, initial encounter for closed fracture: Secondary | ICD-10-CM

## 2012-11-24 DIAGNOSIS — M7989 Other specified soft tissue disorders: Secondary | ICD-10-CM

## 2012-11-24 DIAGNOSIS — X500XXA Overexertion from strenuous movement or load, initial encounter: Secondary | ICD-10-CM

## 2012-11-24 DIAGNOSIS — M25579 Pain in unspecified ankle and joints of unspecified foot: Secondary | ICD-10-CM

## 2012-11-24 MED ORDER — DICLOFENAC SODIUM 50 MG PO TBEC: DELAYED_RELEASE_TABLET | ORAL | Status: DC

## 2012-11-24 MED ORDER — HYDROCODONE-ACETAMINOPHEN 5-500 MG PO TABS: ORAL_TABLET | ORAL | Status: DC

## 2012-11-24 NOTE — Progress Notes (Signed)
CC: Raymond Stewart is a 31 y.o. male is here for right ankle injury   Subjective: HPI:  Patient reports right ankle pain moderate to severe in severity, acute onset when he had an inversion type injury while walking down stairs last night. Immediately had trouble with pain and walking however was able take more than 4 steps at that time. Initially localized to the lateral ankle however he's having some discomfort over the medial malleoli now as well. Reports a history of multiple sprained ankles in the past however this one feels more painful. He's been using crutches to help ambulate Interventions have included rest, ice, elevation. He is required to wear steel toed boots at work, be on his feet for 12 hours at a time, and operating machinery.  He denies coldness, numbness, or motor or sensory disturbances in the right lower extremity.   Review Of Systems Outlined In HPI  Past Medical History  Diagnosis Date  . Smoker 09/04/2012  . ANKLE PAIN, RIGHT 09/04/2010    Qualifier: Diagnosis of  By: Orson Aloe MD, Tinnie Gens       Family History  Problem Relation Age of Onset  . Hypertension Father   . Cancer Other     lung  . Diabetes Other      History  Substance Use Topics  . Smoking status: Current Every Day Smoker -- 0.5 packs/day for 12 years  . Smokeless tobacco: Not on file  . Alcohol Use: No     Objective: Filed Vitals:   11/24/12 0845  BP: 133/83  Pulse: 90    General: Alert and Oriented, No Acute Distress Extremities: Strong dorsalis pedis pulse, pain is localized with palpation of the anterior distal lateral malleoli, mild reproduction of discomfort with firm palpation of the distal anterior medial malleoli. Moderate swelling over the lateral malleoli without discoloration or redness. No pain over the navicular, no pain in the toe box, no pain with palpation of entire fifth metatarsal.  Full-strength of the toes. Pain is not reproduced with compression of the fibula and  tibial. Skin: Warm and dry.  Assessment & Plan: Raymond Stewart was seen today for right ankle injury.  Diagnoses and associated orders for this visit:  Right ankle pain - DG Ankle Complete Right; Future - HYDROcodone-acetaminophen (VICODIN) 5-500 MG per tablet; One to two tabs as needed at bedtime. - diclofenac (VOLTAREN) 50 MG EC tablet; Take one tablet every 8 hours only as needed for pain, take with small snack.    X-ray reveals avulsion fracture just distal to the distal fibula.  Patient has a cam walker at home and will wear this for the next one to 2 weeks, we went over range of motion and rehabilitation exercises that he can start once the swelling and pain improves. I would like him to return in 1-2 weeks at that time we'll put an Aircast splint on, reassess his pain and function, and we'll see if he can return to work wearing safety boots.  Return in about 2 weeks (around 12/08/2012).

## 2012-11-27 ENCOUNTER — Encounter: Payer: Private Health Insurance - Indemnity | Admitting: Physician Assistant

## 2012-11-27 ENCOUNTER — Ambulatory Visit: Payer: Private Health Insurance - Indemnity | Admitting: Family Medicine

## 2012-12-01 ENCOUNTER — Ambulatory Visit (INDEPENDENT_AMBULATORY_CARE_PROVIDER_SITE_OTHER): Payer: Private Health Insurance - Indemnity | Admitting: Family Medicine

## 2012-12-01 ENCOUNTER — Ambulatory Visit: Payer: Private Health Insurance - Indemnity | Admitting: Family Medicine

## 2012-12-01 ENCOUNTER — Encounter: Payer: Self-pay | Admitting: Family Medicine

## 2012-12-01 VITALS — BP 129/79 | HR 72 | Wt 189.0 lb

## 2012-12-01 DIAGNOSIS — S82899A Other fracture of unspecified lower leg, initial encounter for closed fracture: Secondary | ICD-10-CM

## 2012-12-01 DIAGNOSIS — S82839A Other fracture of upper and lower end of unspecified fibula, initial encounter for closed fracture: Secondary | ICD-10-CM

## 2012-12-01 MED ORDER — TRAMADOL HCL 50 MG PO TABS: 50.0000 mg | ORAL_TABLET | Freq: Four times a day (QID) | ORAL | Status: DC | PRN

## 2012-12-01 NOTE — Progress Notes (Signed)
CC: Raymond Stewart is a 31 y.o. male is here for 1 weeks f/u   Subjective: HPI:  Patient returns for right ankle pain in a setting of a closed avulsion fracture seen on plain films. He's been wearing his immobilizer on a daily basis almost 24 hours a day. Once to twice a day he then removing the immobilizer to do exercises with therapy and no resistance range of motion exercises. He believes he 70% better with respect to pain and mobility he tried putting on his steel toe boots but was too painful.  Vicodin is too strong, causes him to be sleepy and confused, he's requesting something less sedating.  His only complaints today is a well localized "pain" just distal to the lateral malleolus and is nonradiating. There is also some soreness just above the lateral malleolus. The above discomfort is worse with plantar flexion, absent at rest.  He denies fevers, chills, motor sensory disturbances in the right lower extremity, swelling, no bruising, nor any sensation of instability of the right ankle   Review Of Systems Outlined In HPI  Past Medical History  Diagnosis Date  . Smoker 09/04/2012  . ANKLE PAIN, RIGHT 09/04/2010    Qualifier: Diagnosis of  By: Orson Aloe MD, Tinnie Gens       Family History  Problem Relation Age of Onset  . Hypertension Father   . Cancer Other     lung  . Diabetes Other      History  Substance Use Topics  . Smoking status: Current Every Day Smoker -- 0.5 packs/day for 12 years  . Smokeless tobacco: Not on file  . Alcohol Use: No     Objective: Filed Vitals:   12/01/12 0823  BP: 129/79  Pulse: 72    General: Alert and Oriented, No Acute Distress Lungs: Comfortable work of breathing Cardiac: Regular rate and rhythm. Extremities: Trace right ankle edema.  Strong peripheral pulses.  Mild bruising just below the lateral malleolus the right ankle. Tender over ATF ligament, compression of fibula and tibia does not cause discomfort. Anterior drawer test reveals no  laxity. Skin: Warm and dry.  Assessment & Plan: Raymond Stewart was seen today for 1 weeks f/u.  Diagnoses and associated orders for this visit:  Closed avulsion fracture of distal fibula  Other Orders - traMADol (ULTRAM) 50 MG tablet; Take 1 tablet (50 mg total) by mouth every 6 (six) hours as needed for pain.    Fibula avulsion: Improving, I placed him in a air cast splint of the right ankle to be worn a daily basis, no longer use cam walker/immobilizer. Continue with home exercise plan on a daily basis. Return Monday for evaluation and possible release back to work.  Return in about 1 week (around 12/08/2012).

## 2012-12-02 ENCOUNTER — Telehealth: Payer: Self-pay | Admitting: *Deleted

## 2012-12-02 DIAGNOSIS — M25571 Pain in right ankle and joints of right foot: Secondary | ICD-10-CM

## 2012-12-02 NOTE — Telephone Encounter (Signed)
Sue Lush, Will you let him know that the medications I've prescribed for the ankle in increasing order of potentency is as follows. Ultram, Diclofenac, Vicodin.  I know that Vicodin was a little to strong for him, if he'd like to cut this in half along with diclofenac that would be reasonable and hopefully wouldn't cause too much sedation.  I'd be happy to send in refills if needed.

## 2012-12-02 NOTE — Telephone Encounter (Signed)
Pt called and and says the Ultram is not helping with the ankle pain.Please advise

## 2012-12-03 ENCOUNTER — Telehealth: Payer: Self-pay | Admitting: Family Medicine

## 2012-12-03 MED ORDER — DICLOFENAC SODIUM 50 MG PO TBEC: DELAYED_RELEASE_TABLET | ORAL | Status: DC

## 2012-12-03 MED ORDER — HYDROCODONE-ACETAMINOPHEN 5-500 MG PO TABS: ORAL_TABLET | ORAL | Status: DC

## 2012-12-03 NOTE — Telephone Encounter (Signed)
Both rx have been faxed to her pharm

## 2012-12-03 NOTE — Telephone Encounter (Signed)
Pt is ok with doing the Vicodin and Diclofenac and he will need refills

## 2012-12-03 NOTE — Telephone Encounter (Signed)
Left message on vm for pt to call me back

## 2012-12-03 NOTE — Telephone Encounter (Signed)
Patient walked-in advised he needs refills fpr Diclofenac and Vicodin today and please call him if you have any concerns. Thanks

## 2012-12-03 NOTE — Telephone Encounter (Signed)
Sue Lush, Diclofenac sent to [facility] on [address], can you please call in the vicodin rx placed in your inbox.

## 2012-12-05 ENCOUNTER — Telehealth: Payer: Self-pay | Admitting: *Deleted

## 2012-12-05 NOTE — Telephone Encounter (Signed)
Yes that would be appropriate.

## 2012-12-05 NOTE — Telephone Encounter (Signed)
Pt's insurance will not pay for the Vicodin until the 21st. Pt wants to know if he can take two of the Ultram for the pain until he can get the vicodin filled

## 2012-12-05 NOTE — Telephone Encounter (Signed)
Left message on vm

## 2012-12-08 ENCOUNTER — Encounter: Payer: Self-pay | Admitting: Family Medicine

## 2012-12-08 ENCOUNTER — Ambulatory Visit (INDEPENDENT_AMBULATORY_CARE_PROVIDER_SITE_OTHER): Payer: Private Health Insurance - Indemnity | Admitting: Family Medicine

## 2012-12-08 VITALS — BP 119/72 | HR 79 | Wt 188.0 lb

## 2012-12-08 DIAGNOSIS — S82839A Other fracture of upper and lower end of unspecified fibula, initial encounter for closed fracture: Secondary | ICD-10-CM

## 2012-12-08 DIAGNOSIS — S82899A Other fracture of unspecified lower leg, initial encounter for closed fracture: Secondary | ICD-10-CM

## 2012-12-08 NOTE — Progress Notes (Signed)
CC: Raymond Stewart is a 31 y.o. male is here for Foot Injury   Subjective: HPI:  Patient returns for followup of right closed avulsion fracture of the fibula. He thinks he is back to over 90% better. He reports full range of motion and strength in the right ankle. He's been doing theraband and  range of motion exercises daily. He has not needed pain medication the last 2 days. He denies new pain, swelling, bruising, nor skin discoloration. He denies motor or sensory disturbances in the right lower extremity. He would like to go back to work.   Review Of Systems Outlined In HPI  Past Medical History  Diagnosis Date  . Smoker 09/04/2012  . ANKLE PAIN, RIGHT 09/04/2010    Qualifier: Diagnosis of  By: Orson Aloe MD, Tinnie Gens       Family History  Problem Relation Age of Onset  . Hypertension Father   . Cancer Other     lung  . Diabetes Other      History  Substance Use Topics  . Smoking status: Current Every Day Smoker -- 0.5 packs/day for 12 years  . Smokeless tobacco: Not on file  . Alcohol Use: No     Objective: Filed Vitals:   12/08/12 0817  BP: 119/72  Pulse: 79    General: Alert and Oriented, No Acute Distress Extremities: No peripheral edema.  Strong peripheral pulses. Right ankle is without pain to palpation over the lateral malleolus, he has full range of motion strength throughout the right ankle and foot. Skin: Warm and dry.  Assessment & Plan: Javian was seen today for foot injury.  Diagnoses and associated orders for this visit:  Closed avulsion fracture of distal fibula    I've asked him to not wear the air splint for the next 24 hours and if he remains painless and there is no deterioration he can stop using the splint. Return to work on Thursday when his shift starts again. Continue range of motion and theraband exercises.  No charge visit as this is covered under [facility]  Return if symptoms worsen or fail to  improve.

## 2012-12-23 ENCOUNTER — Encounter: Payer: Private Health Insurance - Indemnity | Admitting: Physician Assistant

## 2013-01-07 ENCOUNTER — Encounter: Payer: Private Health Insurance - Indemnity | Admitting: Nurse Practitioner

## 2013-01-18 ENCOUNTER — Other Ambulatory Visit: Payer: Self-pay | Admitting: Family Medicine

## 2013-01-26 ENCOUNTER — Encounter: Payer: Self-pay | Admitting: Nurse Practitioner

## 2013-01-26 ENCOUNTER — Ambulatory Visit (INDEPENDENT_AMBULATORY_CARE_PROVIDER_SITE_OTHER): Payer: Private Health Insurance - Indemnity | Admitting: Nurse Practitioner

## 2013-01-26 DIAGNOSIS — R0789 Other chest pain: Secondary | ICD-10-CM

## 2013-01-26 DIAGNOSIS — Z8249 Family history of ischemic heart disease and other diseases of the circulatory system: Secondary | ICD-10-CM

## 2013-01-26 NOTE — Progress Notes (Signed)
Exercise Treadmill Test  Pre-Exercise Testing Evaluation Rhythm: normal sinus  Rate: 75                 Test  Exercise Tolerance Test Ordering MD: Nani Gasser  Interpreting MD: Norma Fredrickson, NP  Unique Test No: 1  Treadmill:  1  Indication for ETT: chest pain - rule out ischemia  Contraindication to ETT: No   Stress Modality: exercise - treadmill  Cardiac Imaging Performed: non   Protocol: standard Bruce - maximal  Max BP:  170/62  Max MPHR (bpm):  189 85% MPR (bpm):  161  MPHR obtained (bpm):  171 % MPHR obtained:  90%  Reached 85% MPHR (min:sec):  9:49 Total Exercise Time (min-sec):  10:30  Workload in METS:  12.4 Borg Scale: 18  Reason ETT Terminated:  patient's desire to stop    ST Segment Analysis At Rest: normal ST segments - no evidence of significant ST depression With Exercise: no evidence of significant ST depression  Other Information Arrhythmia:  No Angina during ETT:  absent (0) Quality of ETT:  diagnostic  ETT Interpretation:  normal - no evidence of ischemia by ST analysis  Comments: Patient presents today for routine GXT. Had atypical chest pain back in November. Then suffered broken ankle shortly thereafter. Has not had recurrent chest pain. Has a positive FH and is a smoker. Currently painfree.  Today he exercised on the standard Bruce protocol for a total of 10:30. He has good exercise tolerance. Adequate blood pressure response. Clinically negative for chest pain. Test was stopped due to fatigue. EKG negative. No arrhythmia.   Recommendations: CV risk factor modification. Smoking cessation.  Period stress testing.  Patient is agreeable to this plan and will call if any problems develop in the interim.

## 2013-04-08 ENCOUNTER — Encounter: Payer: Self-pay | Admitting: Physician Assistant

## 2013-04-08 ENCOUNTER — Ambulatory Visit (INDEPENDENT_AMBULATORY_CARE_PROVIDER_SITE_OTHER): Payer: Private Health Insurance - Indemnity | Admitting: Physician Assistant

## 2013-04-08 VITALS — BP 129/81 | HR 79 | Wt 187.0 lb

## 2013-04-08 DIAGNOSIS — F4323 Adjustment disorder with mixed anxiety and depressed mood: Secondary | ICD-10-CM

## 2013-04-08 DIAGNOSIS — F430 Acute stress reaction: Secondary | ICD-10-CM

## 2013-04-08 MED ORDER — FLUOXETINE HCL 10 MG PO TABS: 10.0000 mg | ORAL_TABLET | Freq: Every day | ORAL | Status: DC

## 2013-04-08 MED ORDER — ALPRAZOLAM 0.25 MG PO TABS: 0.2500 mg | ORAL_TABLET | Freq: Two times a day (BID) | ORAL | Status: DC | PRN

## 2013-04-08 NOTE — Patient Instructions (Signed)
Start Prozac at night as well as xanax as needed up to twice a day. Follow up in 6 weeks.

## 2013-04-08 NOTE — Progress Notes (Signed)
  Subjective:    Patient ID: Raymond Stewart, male    DOB: [birth year], 32 y.o.   MRN: [id number]  HPI Patient presents to the clinic with acute anxiety and depression. His grandfather died 2 weeks ago and his best friend has been missing for 6 days. All he can do is cry. He has no focus. His mind wanders all the time. He denies any feelings of suicide or harming others. She has never been on meds for anxiety or depression.       Review of Systems     Objective:   Physical Exam  Constitutional: He is oriented to person, place, and time. He appears well-developed and well-nourished.  HENT:  Head: Normocephalic and atraumatic.  Cardiovascular: Normal rate, regular rhythm and normal heart sounds.   Pulmonary/Chest: Effort normal and breath sounds normal.  Neurological: He is alert and oriented to person, place, and time.  Skin: Skin is warm and dry.  Psychiatric:  Uncontrollable crying.           Assessment & Plan:  Acute stress reaction/anxiety and depression- PHQ-9 was 17. Gave prozac to start at night and xanax to use as needed up to twice a day. Call if worsening or having bad thoughts. Talked about talking to someone and counseling. He denies at this time stating he has a lot of friends. Follow up in 4-6 weeks.    SPent 30 minutes with patient and greater than 50 percent of visit spent counseling patient regarding anxiety/depression/stress.

## 2013-05-19 DIAGNOSIS — N5082 Scrotal pain: Secondary | ICD-10-CM | POA: Insufficient documentation

## 2013-07-13 ENCOUNTER — Encounter: Payer: Self-pay | Admitting: Family Medicine

## 2013-07-13 ENCOUNTER — Ambulatory Visit (INDEPENDENT_AMBULATORY_CARE_PROVIDER_SITE_OTHER): Payer: 59 | Admitting: Family Medicine

## 2013-07-13 VITALS — BP 126/74 | HR 79 | Wt 193.0 lb

## 2013-07-13 DIAGNOSIS — S76219A Strain of adductor muscle, fascia and tendon of unspecified thigh, initial encounter: Secondary | ICD-10-CM

## 2013-07-13 DIAGNOSIS — IMO0002 Reserved for concepts with insufficient information to code with codable children: Secondary | ICD-10-CM

## 2013-07-13 DIAGNOSIS — M545 Low back pain, unspecified: Secondary | ICD-10-CM

## 2013-07-13 MED ORDER — DICLOFENAC SODIUM 75 MG PO TBEC: 75.0000 mg | DELAYED_RELEASE_TABLET | Freq: Two times a day (BID) | ORAL | Status: DC

## 2013-07-13 NOTE — Progress Notes (Signed)
Right sided back pain that radiates around to the groin. Subjective:    Patient ID: Raymond Stewart, male    DOB: Aug 31, 1981, 32 y.o.   MRN: [id number]  HPI Start with a right sided groin pain that radiated into the testicle a month ago. Worse with going up stairs and getting in the car.  [facility]ent to [facility] and told might had a strangulated hernia. Told to go to the ED.  Did a scan and told had a scan on the testicle. THen referred to Urology and reassured not cuasing his pain.   Mid-back pain that radiates around to the groin. Had spasms in his back last week.  Today feels better.  Says will lay on pillows.  No meds when this happens.  Right groin pain is some better but comes and goes.    Review of Systems  BP 126/74  Pulse 79  Wt 193 lb (87.544 kg)  BMI 24.77 kg/m2    No Known Allergies  Past Medical History  Diagnosis Date  . Smoker 09/04/2012  . ANKLE PAIN, RIGHT 09/04/2010    Qualifier: Diagnosis of  By: Orson Aloe MD, Tinnie Gens      Past Surgical History  Procedure Laterality Date  . Right knee bone spurs  6-00  . Left hand  8-08    History   Social History  . Marital Status: Married    Spouse Name: N/A    Number of Children: N/A  . Years of Education: N/A   Occupational History  . Not on file.   Social History Main Topics  . Smoking status: Current Every Day Smoker -- 0.50 packs/day for 12 years  . Smokeless tobacco: Not on file  . Alcohol Use: No  . Drug Use: No  . Sexually Active: Yes   Other Topics Concern  . Not on file   Social History Narrative  . No narrative on file    Family History  Problem Relation Age of Onset  . Hypertension Father   . Heart disease Father   . Heart attack Father   . Cancer Other     lung  . Diabetes Other     Outpatient Encounter Prescriptions as of 07/13/2013  Medication Sig Dispense Refill  . ALPRAZolam (XANAX) 0.25 MG tablet Take 1 tablet (0.25 mg total) by mouth 2 (two) times daily as needed for sleep.  30 tablet   0  . FLUoxetine (PROZAC) 10 MG tablet Take 1 tablet (10 mg total) by mouth daily.  30 tablet  1  . diclofenac (VOLTAREN) 75 MG EC tablet Take 1 tablet (75 mg total) by mouth 2 (two) times daily.  60 tablet  0   No facility-administered encounter medications on file as of 07/13/2013.          Objective:   Physical Exam  Constitutional: He is oriented to person, place, and time. He appears well-developed and well-nourished.  Musculoskeletal:  Nontender of the lumbar spine but he is mildly tender just to the right of the lumbar spine and paraspinous area. Negative tenderness over the SI joints. Hip, knee, ankle strength is 5 out of 5 bilaterally. Patellar reflexes are 2+ bilaterally. He does have some pain along the right groin with hip flexion against resistance. Normal lumbar flexion, extension, rotation right and left and side bending. He did have some pain in the right groin crease with external rotation of the hip.  Neurological: He is alert and oriented to person, place, and time.  Skin: Skin  is warm and dry.  Psychiatric: He has a normal mood and affect. His behavior is normal.          Assessment & Plan:  Back pain, right-sided-low back pain. He did have an MRI a couple years ago. Not sure the exact results. I did look in the chart and could not find a copy. We can always call Dr. Ruel Favors office to get a copy. I would like to get an x-ray today. I'm not sure if this is related to the right groin pain or not. I think at this point they may be 2 separate issues and will treat him as such. Recommend anti-inflammatory twice a day for at least a week. Can continue as needed after that. He should take with food and water and stop immediately if any GI upset or irritation. He is not currently taking any medications for his back. Recommend ice or heating pad whichever is more comfortable.  Right groin strain - given handout on right groin strain. Recommend anti-inflammatory as above. If not  improving over the next 2-3 weeks and please let me know and we'll refer for further evaluation and treatment.

## 2013-08-03 ENCOUNTER — Ambulatory Visit (INDEPENDENT_AMBULATORY_CARE_PROVIDER_SITE_OTHER): Payer: Private Health Insurance - Indemnity | Admitting: Physician Assistant

## 2013-08-03 ENCOUNTER — Ambulatory Visit (INDEPENDENT_AMBULATORY_CARE_PROVIDER_SITE_OTHER): Payer: 59

## 2013-08-03 ENCOUNTER — Ambulatory Visit: Payer: 59 | Admitting: Family Medicine

## 2013-08-03 ENCOUNTER — Encounter: Payer: Self-pay | Admitting: Physician Assistant

## 2013-08-03 VITALS — BP 118/72 | HR 77 | Ht 74.0 in | Wt 197.0 lb

## 2013-08-03 DIAGNOSIS — M545 Low back pain, unspecified: Secondary | ICD-10-CM

## 2013-08-03 DIAGNOSIS — M5442 Lumbago with sciatica, left side: Secondary | ICD-10-CM

## 2013-08-03 DIAGNOSIS — M5430 Sciatica, unspecified side: Secondary | ICD-10-CM

## 2013-08-03 DIAGNOSIS — IMO0002 Reserved for concepts with insufficient information to code with codable children: Secondary | ICD-10-CM

## 2013-08-03 MED ORDER — DICLOFENAC SODIUM 75 MG PO TBEC: 75.0000 mg | DELAYED_RELEASE_TABLET | Freq: Two times a day (BID) | ORAL | Status: DC

## 2013-08-03 MED ORDER — PREDNISONE 50 MG PO TABS: ORAL_TABLET | ORAL | Status: DC

## 2013-08-03 NOTE — Progress Notes (Signed)
  Subjective:    Patient ID: Raymond Stewart, male    DOB: 01/17/1981, 32 y.o.   MRN: [id number]  HPI Patient presents to the clinic following up of left sided low back pain into his buttocks and radiating down his left leg. Pain is not every day but when it is bad it is really bad. At its worst causes numbness into left foot. This has been and issue for the last 2 months. Dr. Linford Arnold gave muscle relaxers which pt stated didn't help and diclofenac which did help with symptoms. He has not had any imaging. Denies any trauma or history of back problems.      Review of Systems     Objective:   Physical Exam  Constitutional: He appears well-developed and well-nourished.  HENT:  Head: Normocephalic and atraumatic.  Musculoskeletal:  Pain with external ROM of the left hip. NO pain with palpation over lumbar spine or Paraspinous muscles. Some generalized tenderness over the left greater trochanteric bursa. Negative straight leg test today. Stiffness with side to side ROM. Patellar reflexes 2+. Strength 5/5 of bilateral legs.  No pain over SI joint.  Psychiatric: He has a normal mood and affect. His behavior is normal.          Assessment & Plan:  Left lower back with radiculopathy- Will get lumbar spine xrays today. Dr. Linford Arnold already had one ordered and accepted order but was ordered for right sided low back pain which was not mentioned today. Gave prednisone burst for 5 days. Continue home exercises. Continue on diclofenac. If lumbar spine are questionable may consider MRI of back. If normal may need to get imaging done of left hip.

## 2013-08-03 NOTE — Patient Instructions (Signed)
Continue on diclofenac. Start prednisone taper.  Continue home exercises. Will call with imaging results.

## 2013-08-10 ENCOUNTER — Encounter: Payer: Self-pay | Admitting: Family Medicine

## 2013-08-10 ENCOUNTER — Other Ambulatory Visit: Payer: Self-pay | Admitting: Family Medicine

## 2013-08-10 DIAGNOSIS — M25571 Pain in right ankle and joints of right foot: Secondary | ICD-10-CM

## 2013-08-11 MED ORDER — HYDROCODONE-ACETAMINOPHEN 5-500 MG PO TABS: ORAL_TABLET | ORAL | Status: DC

## 2013-08-24 ENCOUNTER — Ambulatory Visit: Payer: 59 | Admitting: Family Medicine

## 2013-08-24 DIAGNOSIS — Z0289 Encounter for other administrative examinations: Secondary | ICD-10-CM

## 2013-09-04 ENCOUNTER — Ambulatory Visit (INDEPENDENT_AMBULATORY_CARE_PROVIDER_SITE_OTHER): Payer: 59

## 2013-09-04 ENCOUNTER — Ambulatory Visit (INDEPENDENT_AMBULATORY_CARE_PROVIDER_SITE_OTHER): Payer: 59 | Admitting: Family Medicine

## 2013-09-04 ENCOUNTER — Encounter: Payer: Self-pay | Admitting: Family Medicine

## 2013-09-04 VITALS — BP 133/80 | HR 87 | Wt 197.0 lb

## 2013-09-04 DIAGNOSIS — M25559 Pain in unspecified hip: Secondary | ICD-10-CM

## 2013-09-04 DIAGNOSIS — R109 Unspecified abdominal pain: Secondary | ICD-10-CM

## 2013-09-04 DIAGNOSIS — M25551 Pain in right hip: Secondary | ICD-10-CM

## 2013-09-04 DIAGNOSIS — S76219S Strain of adductor muscle, fascia and tendon of unspecified thigh, sequela: Secondary | ICD-10-CM

## 2013-09-04 MED ORDER — MELOXICAM 7.5 MG PO TABS: 7.5000 mg | ORAL_TABLET | Freq: Every day | ORAL | Status: DC

## 2013-09-04 NOTE — Progress Notes (Signed)
  Subjective:    Patient ID: Raymond Stewart, male    DOB: June 24, 1981, 32 y.o.   MRN: [id number]  HPI Seen on 07/2013 and reported Patient presents to the clinic following up of left sided low back pain into his buttocks and radiating down his left leg. Pain is not every day but when it is bad it is really bad. At its worst causes numbness into left foot. This has been and issue for the last 2 months. Dr. Linford Arnold gave muscle relaxers which pt stated didn't help and diclofenac which did help with symptoms. He has not had any imaging. Denies any trauma or history of back problems.   Took the steroids,had a normla lumbar xray, and the left side is feeling better but then about 5 days ago started having shooting pain in the right groin crease. Felt like a strain every time took a step.  Feels a sharp shooting pain with walking.  Feels it when turns over in bed. His back acutally feels ok.  No fever, chills or sweats or rash. He had similar symptoms about 2 months ago when I had seen him after being sent to the emergency department by [facility] for possible hernia. He did not have a hernia. He says it's painful to swing his leg with walking. It's painful to extend his leg backward and to abduct his hip. He is not overweight and denies wearing any tightfitting clothing, bowel et Karie Soda.   Review of Systems     Objective:   Physical Exam  Constitutional: He appears well-developed and well-nourished.  HENT:  Head: Normocephalic and atraumatic.  Musculoskeletal:   Right hip with normal range of motion but he does have some discomfort with full extension and internal rotation of the hip and external rotation of the hip. He also has pain with swinging his leg across the opposite leg. Hip, knee, ankle strength is 5 out of 5 in both legs. He'll reflexes are 2+ in both legs.  Skin: Skin is warm and dry.  Psychiatric: He has a normal mood and affect.          Assessment & Plan:  Right hip pain-I  strongly suspect groin strain. He's very concerned because he had similar pain about 2 months ago and now it's back. He is very active and plays basketball and golf so it certainly possible it was injured during playing a sport that he does not remember a specific injury. We'll get him a handout on stretches to do at home. Will put him on Mobic. He didn't seem to respond very well to anti-inflammatories or prednisone previously for this condition but I would like to try different NSAIDs. I would also like to see my partner Dr. Rodney Langton sometime next week for further evaluation if his symptoms persist. He is very concerned about the potential for infection or cancer. I tried to reassure him as much as possible. We will get an x-ray of the right hip today to also help reassure him.

## 2013-09-04 NOTE — Patient Instructions (Addendum)
Start exercises and taking mobic.  Please schedule an appt with Dr. Benjamin Stain sometime next week

## 2013-09-06 ENCOUNTER — Other Ambulatory Visit: Payer: Self-pay | Admitting: Family Medicine

## 2013-09-06 DIAGNOSIS — M25571 Pain in right ankle and joints of right foot: Secondary | ICD-10-CM

## 2013-09-07 MED ORDER — HYDROCODONE-ACETAMINOPHEN 5-500 MG PO TABS: ORAL_TABLET | ORAL | Status: DC

## 2013-09-08 ENCOUNTER — Ambulatory Visit (INDEPENDENT_AMBULATORY_CARE_PROVIDER_SITE_OTHER): Payer: 59 | Admitting: Sports Medicine

## 2013-09-08 ENCOUNTER — Encounter: Payer: Self-pay | Admitting: Sports Medicine

## 2013-09-08 VITALS — BP 132/77 | HR 73 | Wt 195.0 lb

## 2013-09-08 DIAGNOSIS — M1610 Unilateral primary osteoarthritis, unspecified hip: Secondary | ICD-10-CM

## 2013-09-08 DIAGNOSIS — M169 Osteoarthritis of hip, unspecified: Secondary | ICD-10-CM

## 2013-09-08 DIAGNOSIS — M160 Bilateral primary osteoarthritis of hip: Secondary | ICD-10-CM

## 2013-09-08 DIAGNOSIS — M1611 Unilateral primary osteoarthritis, right hip: Secondary | ICD-10-CM | POA: Insufficient documentation

## 2013-09-08 NOTE — Progress Notes (Signed)
   Subjective:    I'm seeing this patient as a consultation for:  Dr. Linford Arnold and Tandy Gaw, PA-C  CC: Right hip pain  HPI: This is a pleasant 32 year old male who was seen for wrist pain in the past, he comes in with on and off pain in his hip. He has had his lumbar spine evaluated which was overall negative, he was also placed on prednisone and muscle relaxers which were ineffective. More recently he had pain in his right groin, this prompted an x-ray of his pelvis which showed moderate to severe arthritis in both hips. He has been on anti-inflammatories, narcotics, and has had physical therapy with no improvement. He was referred to me for further evaluation and definitive treatment. To those are moderate, persistent, no radiation, localized in the groin, worse with ambulation and hip flexion. He denies any constitutional symptoms, such as fevers, chills, weight loss, night sweats, no tick bites, no previous trauma, no history of hip dysplasia as a child, no other possible underlying cause for such early and severe hip arthritis in a young patient.  Past medical history, Surgical history, Family history not pertinant except as noted below, Social history, Allergies, and medications have been entered into the medical record, reviewed, and no changes needed.   Review of Systems: No headache, visual changes, nausea, vomiting, diarrhea, constipation, dizziness, abdominal pain, skin rash, fevers, chills, night sweats, weight loss, swollen lymph nodes, body aches, joint swelling, muscle aches, chest pain, shortness of breath, mood changes, visual or auditory hallucinations.   Objective:   General: Well Developed, well nourished, and in no acute distress.  Neuro/Psych: Alert and oriented x3, extra-ocular muscles intact, able to move all 4 extremities, sensation grossly intact. Skin: Warm and dry, no rashes noted.  Respiratory: Not using accessory muscles, speaking in full sentences, trachea  midline.  Cardiovascular: Pulses palpable, no extremity edema. Abdomen: Does not appear distended. Right Hip: ROM Only has approximately 10 of hip internal rotation without pain, hip flexion is also weak. Pelvic alignment unremarkable to inspection and palpation. Standing hip rotation and gait without trendelenburg sign / unsteadiness. Greater trochanter without tenderness to palpation. No tenderness over piriformis and greater trochanter. No pain with FABER or FADIR. No SI joint tenderness and normal minimal SI movement.  X-rays were reviewed and show moderate to severe hip osteoarthritis, right worse than left.  Procedure: Real-time Ultrasound Guided Injection of right femoroacetabular joint Device: GE Logiq E  Verbal informed consent obtained.  Time-out conducted.  Noted no overlying erythema, induration, or other signs of local infection.  Skin prepped in a sterile fashion.  Local anesthesia: Topical Ethyl chloride.  With sterile technique and under real time ultrasound guidance:  Spinal needle advanced to the hip head/neck junction, 2 cc Kenalog 40, 4 cc lidocaine injected easily. Completed without difficulty  Pain immediately resolved suggesting accurate placement of the medication.  Advised to call if fevers/chills, erythema, induration, drainage, or persistent bleeding.  Images permanently stored and available for review in the ultrasound unit.  Impression: Technically successful ultrasound guided injection.  Impression and Recommendations:   This case required medical decision making of moderate complexity.

## 2013-09-08 NOTE — Assessment & Plan Note (Addendum)
Right worse than left. Femoroacetabular joint injection as above, failed PT, NSAIDs, muscle relaxers. Arthritis is severe and very surprising for someone his age, and with his body habitus. I am going to obtain a rheumatoid workup including quantiferon gold testing. Like to see him back in one month. He needs to work extensively on hip flexor rehabilitation in the meantime. Unfortunately, I do think he will need to proceed to total hip arthroplasty within the decade.

## 2013-09-09 LAB — CBC WITH DIFFERENTIAL/PLATELET
Basophils Absolute: 0 10*3/uL (ref 0.0–0.1)
Basophils Relative: 1 % (ref 0–1)
Eosinophils Absolute: 0.4 K/uL (ref 0.0–0.7)
Eosinophils Relative: 6 % — ABNORMAL HIGH (ref 0–5)
HCT: 45.5 % (ref 39.0–52.0)
Hemoglobin: 15.9 g/dL (ref 13.0–17.0)
Lymphocytes Relative: 40 % (ref 12–46)
Lymphs Abs: 3 K/uL (ref 0.7–4.0)
MCH: 31.4 pg (ref 26.0–34.0)
MCHC: 34.9 g/dL (ref 30.0–36.0)
MCV: 89.9 fL (ref 78.0–100.0)
Monocytes Absolute: 0.8 10*3/uL (ref 0.1–1.0)
Monocytes Relative: 11 % (ref 3–12)
Neutro Abs: 3.2 K/uL (ref 1.7–7.7)
Neutrophils Relative %: 42 % — ABNORMAL LOW (ref 43–77)
Platelets: 243 K/uL (ref 150–400)
RBC: 5.06 MIL/uL (ref 4.22–5.81)
RDW: 14 % (ref 11.5–15.5)
WBC: 7.4 K/uL (ref 4.0–10.5)

## 2013-09-09 LAB — COMPREHENSIVE METABOLIC PANEL WITH GFR
ALT: 11 U/L (ref 0–53)
Albumin: 4.6 g/dL (ref 3.5–5.2)
CO2: 31 meq/L (ref 19–32)
Chloride: 99 meq/L (ref 96–112)
Glucose, Bld: 93 mg/dL (ref 70–99)
Potassium: 4.3 meq/L (ref 3.5–5.3)
Sodium: 137 meq/L (ref 135–145)
Total Bilirubin: 0.9 mg/dL (ref 0.3–1.2)
Total Protein: 7 g/dL (ref 6.0–8.3)

## 2013-09-09 LAB — COMPREHENSIVE METABOLIC PANEL
AST: 16 U/L (ref 0–37)
Alkaline Phosphatase: 73 U/L (ref 39–117)
BUN: 12 mg/dL (ref 6–23)
Calcium: 9.7 mg/dL (ref 8.4–10.5)
Creat: 0.93 mg/dL (ref 0.50–1.35)

## 2013-09-09 LAB — SEDIMENTATION RATE: Sed Rate: 1 mm/h (ref 0–16)

## 2013-09-09 LAB — URIC ACID: Uric Acid, Serum: 5.1 mg/dL (ref 4.0–7.8)

## 2013-09-09 LAB — ANA: Anti Nuclear Antibody(ANA): NEGATIVE

## 2013-09-09 LAB — RHEUMATOID FACTOR: Rheumatoid fact SerPl-aCnc: <10 [IU]/mL (ref <=14)

## 2013-09-10 LAB — CYCLIC CITRUL PEPTIDE ANTIBODY, IGG: Cyclic Citrullin Peptide Ab: <2 U/mL (ref 0.0–5.0)

## 2013-09-11 LAB — QUANTIFERON TB GOLD ASSAY (BLOOD)
Interferon Gamma Release Assay: NEGATIVE
Mitogen value: 8.8 [IU]/mL
Quantiferon Nil Value: 0.03 IU/mL
Quantiferon Tb Ag Minus Nil Value: 0 [IU]/mL
TB Ag value: 0.03 IU/mL

## 2013-10-09 ENCOUNTER — Encounter: Payer: Self-pay | Admitting: Sports Medicine

## 2013-10-09 ENCOUNTER — Ambulatory Visit (INDEPENDENT_AMBULATORY_CARE_PROVIDER_SITE_OTHER): Payer: 59 | Admitting: Sports Medicine

## 2013-10-09 VITALS — BP 119/76 | HR 69 | Wt 189.0 lb

## 2013-10-09 DIAGNOSIS — M169 Osteoarthritis of hip, unspecified: Secondary | ICD-10-CM

## 2013-10-09 DIAGNOSIS — M160 Bilateral primary osteoarthritis of hip: Secondary | ICD-10-CM

## 2013-10-09 DIAGNOSIS — M1610 Unilateral primary osteoarthritis, unspecified hip: Secondary | ICD-10-CM

## 2013-10-09 NOTE — Assessment & Plan Note (Signed)
Pain is completely resolved at the right hip injection. Return as needed. I still do think he will proceed to total hip arthroplasty within the decade.

## 2013-10-09 NOTE — Progress Notes (Signed)
  Subjective:    CC: Followup.  HPI: Bilateral Hip Osteoarthritis: Completely pain free after injection one month ago.  Hasn't had to use any analgesics.  Not really doing his home exercises.  Past medical history, Surgical history, Family history not pertinant except as noted below, Social history, Allergies, and medications have been entered into the medical record, reviewed, and no changes needed.   Review of Systems: No fevers, chills, night sweats, weight loss, chest pain, or shortness of breath.   Objective:    General: Well Developed, well nourished, and in no acute distress.  Neuro: Alert and oriented x3, extra-ocular muscles intact, sensation grossly intact.  HEENT: Normocephalic, atraumatic, pupils equal round reactive to light, neck supple, no masses, no lymphadenopathy, thyroid nonpalpable.  Skin: Warm and dry, no rashes. Cardiac: Regular rate and rhythm, no murmurs rubs or gallops, no lower extremity edema.  Respiratory: Clear to auscultation bilaterally. Not using accessory muscles, speaking in full sentences. Right Hip: ROM IR: 45 Deg, ER: 45 Deg, Flexion: 120 Deg, Extension: 100 Deg, Abduction: 45 Deg, Adduction: 45 Deg Strength IR: 5/5, ER: 5/5, Flexion: 5/5, Extension: 5/5, Abduction: 5/5, Adduction: 5/5 Pelvic alignment unremarkable to inspection and palpation. Standing hip rotation and gait without trendelenburg sign / unsteadiness. Greater trochanter without tenderness to palpation. No tenderness over piriformis and greater trochanter. No pain with FABER or FADIR. No SI joint tenderness and normal minimal SI movement.  Impression and Recommendations:

## 2014-04-02 ENCOUNTER — Ambulatory Visit: Payer: 59 | Admitting: Family Medicine

## 2014-04-07 ENCOUNTER — Ambulatory Visit (INDEPENDENT_AMBULATORY_CARE_PROVIDER_SITE_OTHER): Payer: 59 | Admitting: Sports Medicine

## 2014-04-07 ENCOUNTER — Encounter: Payer: Self-pay | Admitting: Sports Medicine

## 2014-04-07 ENCOUNTER — Ambulatory Visit (INDEPENDENT_AMBULATORY_CARE_PROVIDER_SITE_OTHER): Payer: 59 | Admitting: Family Medicine

## 2014-04-07 ENCOUNTER — Ambulatory Visit: Payer: 59 | Admitting: Family Medicine

## 2014-04-07 ENCOUNTER — Encounter: Payer: Self-pay | Admitting: Family Medicine

## 2014-04-07 VITALS — BP 125/77 | HR 72 | Ht 74.0 in | Wt 192.0 lb

## 2014-04-07 DIAGNOSIS — M65849 Other synovitis and tenosynovitis, unspecified hand: Secondary | ICD-10-CM

## 2014-04-07 DIAGNOSIS — M25559 Pain in unspecified hip: Secondary | ICD-10-CM

## 2014-04-07 DIAGNOSIS — M65839 Other synovitis and tenosynovitis, unspecified forearm: Secondary | ICD-10-CM

## 2014-04-07 DIAGNOSIS — M779 Enthesopathy, unspecified: Principal | ICD-10-CM

## 2014-04-07 DIAGNOSIS — M778 Other enthesopathies, not elsewhere classified: Secondary | ICD-10-CM | POA: Insufficient documentation

## 2014-04-07 NOTE — Progress Notes (Signed)
   Subjective:    I'm seeing this patient as a consultation for:  Dr. Ivan AnchorsHommel  CC: Left hand pain  HPI: This is a pleasant 33 year old male, sometime ago I injected his hip joint he continued to be pain-free. Unfortunately recently he developed pain he localizes the volar aspect of the left third metacarpophalangeal joint, worse with flexion and extension of his third digit. Pain is severe, persistent, he was seen by Dr. Ivan AnchorsHommel, and I was asked to consult for further evaluation and treatment.  Past medical history, Surgical history, Family history not pertinant except as noted below, Social history, Allergies, and medications have been entered into the medical record, reviewed, and no changes needed.   Review of Systems: No headache, visual changes, nausea, vomiting, diarrhea, constipation, dizziness, abdominal pain, skin rash, fevers, chills, night sweats, weight loss, swollen lymph nodes, body aches, joint swelling, muscle aches, chest pain, shortness of breath, mood changes, visual or auditory hallucinations.   Objective:   General: Well Developed, well nourished, and in no acute distress.  Neuro/Psych: Alert and oriented x3, extra-ocular muscles intact, able to move all 4 extremities, sensation grossly intact. Skin: Warm and dry, no rashes noted.  Respiratory: Not using accessory muscles, speaking in full sentences, trachea midline.  Cardiovascular: Pulses palpable, no extremity edema. Abdomen: Does not appear distended. Left hand: Tender to palpation along the flexor tendon sheath predominantly just proximal to the metacarpophalangeal joint, there is reproduction of pain with passive extension and active flexion of the finger, there is no discrete tenderness to palpation at the joint lines of the metacarpophalangeal joint or with varus or valgus stress at the metacarpophalangeal joint.  Procedure: Real-time Ultrasound Guided Injection of left third flexor tendon sheath and left third  metacarpophalangeal joint. Device: GE Logiq E  Verbal informed consent obtained.  Time-out conducted.  Noted no overlying erythema, induration, or other signs of local infection.  Skin prepped in a sterile fashion.  Local anesthesia: Topical Ethyl chloride.  With sterile technique and under real time ultrasound guidance:  25-gauge needle advanced into the flexor tendon sheath, approximately 0.5 cc Kenalog 40, 2 cc lidocaine injected easily into the flexor tendon sheath, the needle was then redirected, into the metacarpophalangeal joint from a volar approach, approximately 0.5 cc Kenalog 40, 0.5 cc lidocaine injected easily. Completed without difficulty  Pain immediately resolved suggesting accurate placement of the medication.  Advised to call if fevers/chills, erythema, induration, drainage, or persistent bleeding.  Images permanently stored and available for review in the ultrasound unit.  Impression: Technically successful ultrasound guided injection.  Impression and Recommendations:   This case required medical decision making of moderate complexity.

## 2014-04-07 NOTE — Assessment & Plan Note (Signed)
Golf tournament coming up, injections placed into the left third flexor tendon sheath as well as the left third metacarpophalangeal joint. Return in one month.

## 2014-04-07 NOTE — Progress Notes (Signed)
CC: Raymond HemanRobert W Stewart is a 33 y.o. male is here for Hand Pain   Subjective: HPI:  Complains of left hand pain localized at the base of the third metacarpal phalangeal joint, palmar surface, nonradiating. It is more localized to the proximal palmar flexion and more localized distally in the proximal phalanx with complete extension. Symptoms have been present for the last month, present on a daily basis. Worse with gripping or full extension. He denies any swelling redness warmth nor any recent or remote injury. Years ago he tells me that he had an infection and required incision and drainage, upon further questioning it sounds like this was a long the flexor tendon sheath.  He denies fevers chills, nausea, nor any other joint pain currently. No interventions as of yet. He is leaving for leak of playing golf tomorrow  and wants to know if there is any urgent intervention that can be done.  Review Of Systems Outlined In HPI  Past Medical History  Diagnosis Date  . Smoker 09/04/2012  . ANKLE PAIN, RIGHT 09/04/2010    Qualifier: Diagnosis of  By: Orson AloeHenderson MD, Tinnie GensJeffrey      Past Surgical History  Procedure Laterality Date  . Right knee bone spurs  6-00  . Left hand  8-08   Family History  Problem Relation Age of Onset  . Hypertension Father   . Heart disease Father   . Heart attack Father   . Cancer Other     lung  . Diabetes Other     History   Social History  . Marital Status: Married    Spouse Name: N/A    Number of Children: N/A  . Years of Education: N/A   Occupational History  . Not on file.   Social History Main Topics  . Smoking status: Current Every Day Smoker -- 0.50 packs/day for 12 years  . Smokeless tobacco: Not on file  . Alcohol Use: No  . Drug Use: No  . Sexual Activity: Yes   Other Topics Concern  . Not on file   Social History Narrative  . No narrative on file     Objective: BP 125/77  Pulse 72  Ht 6\' 2"  (1.88 m)  Wt 192 lb (87.091 kg)  BMI 24.64  kg/m2  General: Alert and Oriented, No Acute Distress HEENT: Pupils equal, round, reactive to light. Conjunctivae clear.  Moist membranes Lungs: Clear comfortable work Cardiac: Regular rate and rhythm. \ Extremities: No peripheral edema.  Strong peripheral pulses. There is no overlying swelling, redness, nor warmth of the third digit on the left hand nor proximally in the palm dorsal or volar surface.  No restriction to flexion throughout the third digit however extension is deficient by approximately 15 at the PIP. Full-strength throughout this digit, pain is reproduced with compression of flexor tendon sheath with passive or active flexion of the third digit. Mental Status: No depression, anxiety, nor agitation. Skin: Warm and dry.  Assessment & Plan: Raymond MaduroRobert was seen today for hand pain.  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 Plan was for buddy taping with oral prednisone taper however Dr. Karie Schwalbe. was free this morning to provide corticosteroid injection directly to the tendon sheath in hopes that patient would be able to play in a golf tournament starting tomorrow. Followup when he returns to town if pain is  persistent.  Return if symptoms worsen or fail to improve.

## 2014-04-30 ENCOUNTER — Emergency Department
Admission: EM | Admit: 2014-04-30 | Discharge: 2014-04-30 | Disposition: A | Discharge status: Home / Self Care | Payer: 59 | Source: Home / Self Care | Attending: Family Medicine | Admitting: Family Medicine

## 2014-04-30 ENCOUNTER — Encounter: Payer: Self-pay | Admitting: Emergency Medicine

## 2014-04-30 DIAGNOSIS — H6690 Otitis media, unspecified, unspecified ear: Secondary | ICD-10-CM

## 2014-04-30 DIAGNOSIS — H6691 Otitis media, unspecified, right ear: Secondary | ICD-10-CM

## 2014-04-30 DIAGNOSIS — H6120 Impacted cerumen, unspecified ear: Secondary | ICD-10-CM

## 2014-04-30 MED ORDER — AMOXICILLIN 875 MG PO TABS: 875.0000 mg | ORAL_TABLET | Freq: Two times a day (BID) | ORAL | Status: DC

## 2014-04-30 NOTE — Discharge Instructions (Signed)
Take plain Mucinex (1200 mg guaifenesin) twice daily for cough and congestion.  Add Sudafed for sinus congestion.   Increase fluid intake. May use Afrin nasal spray (or generic oxymetazoline) twice daily for about 5 days.  Also recommend using saline nasal spray several times daily and saline nasal irrigation (AYR is a common brand)    Cerumen Plug A cerumen plug is having too much wax in your ear canal. The outer ear canal is lined with hairs and glands that secrete wax. This wax is called cerumen. This protects the ear canal. It also helps prevent material from entering the ear. Too much wax can cause a feeling of fullness in the ears, decreased hearing, ringing in the ears, or an earache. Sometimes your caregiver will remove a cerumen plug with an instrument called a curette. Or he/she may flush the ear canal with warm water from a syringe to remove the wax. You may simply be sent home to follow the home care instructions below for wax removal. Generally ear wax does not have to be removed unless it is causing a problem such as one of those listed above. When too much wax is causing a problem, the following are a few home remedies which can be used to help this problem. HOME CARE INSTRUCTIONS   Put a couple drops of glycerin, baby oil, or mineral oil in the ear a couple times of day. Do this every day for several days. After putting the drops in, you will need to lay with the affected ear pointing up for a couple minutes. This allows the drops to remain in the canal and run down to the area of wax blockage. This will soften the wax plug. It may also make your hearing worse as the wax softens and blocks the canal even more.  After a couple days, you may gently flush the ear canal with warm water from a syringe. Do this by pulling your ear up and back with your head tilted slightly forward and towards a pan to catch the water. This is most easily done with a helper. You can also accomplish the same  thing by letting the shower beat into your ear canal to wash the wax out. Sometimes this will not be immediately successful. You will have to return to the first step of using the oil to further soften the wax. Then resume washing the ear canal out with a syringe or shower.  Following removal of the wax, put ten to twenty drops of rubbing alcohol into the outer ears. This will dry the canal and prevent an infection.  Do not irrigate or wash out your ears if you have had a perforated ear drum or mastoid surgery. SEEK IMMEDIATE MEDICAL CARE IF:   You are unsuccessful with the above instructions for home care.  You develop ear pain or drainage from the ear. MAKE SURE YOU:   Understand these instructions.  Will watch your condition.  Will get help right away if you are not doing well or get worse. Document Released: 08/28/2001 Document Revised: 02/25/2012 Document Reviewed: 11/24/2008 [facility] Patient Information 2014 [employer] [place].

## 2014-04-30 NOTE — ED Provider Notes (Signed)
CSN: [id number]     Arrival date & time 04/30/14  0830 History   First MD Initiated Contact with Patient 04/30/14 [phone]     Chief Complaint  Patient presents with  . Otalgia  . Headache      HPI Comments: Patient complains of onset of right earache, hearing loss, and right side headache yesterday.  No fevers, chills, and sweats   The history is provided by the patient.    Past Medical History  Diagnosis Date  . Smoker 09/04/2012  . ANKLE PAIN, RIGHT 09/04/2010    Qualifier: Diagnosis of  By: Orson AloeHenderson MD, Tinnie GensJeffrey     Past Surgical History  Procedure Laterality Date  . Right knee bone spurs  6-00  . Left hand  8-08   Family History  Problem Relation Age of Onset  . Hypertension Father   . Heart disease Father   . Heart attack Father   . Cancer Other     lung  . Diabetes Other    History  Substance Use Topics  . Smoking status: Current Every Day Smoker -- 0.50 packs/day for 12 years  . Smokeless tobacco: Not on file  . Alcohol Use: No    Review of Systems No sore throat No cough No pleuritic pain No wheezing No nasal congestion No post-nasal drainage No sinus pain/pressure No itchy/red eyes + right earache No hemoptysis No SOB No fever/chills No nausea No vomiting No abdominal pain No diarrhea No urinary symptoms No skin rash No fatigue No myalgias + headache    Allergies  Review of patient's allergies indicates no known allergies.  Home Medications   Prior to Admission medications   Not on File   BP 119/77  Pulse 63  Temp(Src) 97.7 F (36.5 C) (Oral)  Resp 14  Wt 192 lb (87.091 kg)  SpO2 98% Physical Exam Nursing notes and Vital Signs reviewed. Appearance:  Patient appears healthy, stated age, and in no acute distress Eyes:  Pupils are equal, round, and reactive to light and accomodation.  Extraocular movement is intact.  Conjunctivae are not inflamed  Ears:  Left canal and tympanic membrane normal.  Right canal occluded with cerumen.   Post lavage, right canal is normal and right tympanic membrane is erythematous and bulging. Nose:  Normal turbinates.  No sinus tenderness.   Pharynx:  Normal Neck:  Supple.  No adenopathy Skin:  No rash present.   ED Course  Procedures none       MDM   1. Cerumen impaction   2. Right otitis media    Begin amoxicillin Take plain Mucinex (1200 mg guaifenesin) twice daily for cough and congestion.  Add Sudafed for sinus congestion.   Increase fluid intake. May use Afrin nasal spray (or generic oxymetazoline) twice daily for about 5 days.  Also recommend using saline nasal spray several times daily and saline nasal irrigation (AYR is a common brand) Followup with ENT if not improved 10 days.    Lattie HawStephen A Jamaira Sherk, MD 05/04/14 1011

## 2014-04-30 NOTE — ED Notes (Signed)
Raymond Stewart c/o right ear pain, hearing loss and right sided HA x yesterday. Denies fever.

## 2014-05-07 ENCOUNTER — Encounter: Payer: Self-pay | Admitting: Sports Medicine

## 2014-05-07 ENCOUNTER — Ambulatory Visit (INDEPENDENT_AMBULATORY_CARE_PROVIDER_SITE_OTHER): Payer: 59 | Admitting: Sports Medicine

## 2014-05-07 VITALS — BP 118/71 | HR 65 | Ht 74.0 in | Wt 191.0 lb

## 2014-05-07 DIAGNOSIS — M779 Enthesopathy, unspecified: Principal | ICD-10-CM

## 2014-05-07 DIAGNOSIS — M778 Other enthesopathies, not elsewhere classified: Secondary | ICD-10-CM

## 2014-05-07 DIAGNOSIS — M65839 Other synovitis and tenosynovitis, unspecified forearm: Secondary | ICD-10-CM

## 2014-05-07 DIAGNOSIS — M65849 Other synovitis and tenosynovitis, unspecified hand: Secondary | ICD-10-CM

## 2014-05-07 NOTE — Assessment & Plan Note (Signed)
Symptoms completely resolved after left third flexor tendon sheath and left third metacarpophalangeal joint injections a month ago.

## 2014-05-07 NOTE — Progress Notes (Signed)
  Subjective:    CC: Followup  HPI: Right hand pain: Injections placed into the right flexor tendon sheath, third, and right third metacarpophalangeal joint at the last visit and pain is continually resolve.  Past medical history, Surgical history, Family history not pertinant except as noted below, Social history, Allergies, and medications have been entered into the medical record, reviewed, and no changes needed.   Review of Systems: No fevers, chills, night sweats, weight loss, chest pain, or shortness of breath.   Objective:    General: Well Developed, well nourished, and in no acute distress.  Neuro: Alert and oriented x3, extra-ocular muscles intact, sensation grossly intact.  HEENT: Normocephalic, atraumatic, pupils equal round reactive to light, neck supple, no masses, no lymphadenopathy, thyroid nonpalpable.  Skin: Warm and dry, no rashes. Cardiac: Regular rate and rhythm, no murmurs rubs or gallops, no lower extremity edema.  Respiratory: Clear to auscultation bilaterally. Not using accessory muscles, speaking in full sentences.  Impression and Recommendations:

## 2014-05-17 ENCOUNTER — Encounter: Payer: Self-pay | Admitting: Family Medicine

## 2014-05-17 ENCOUNTER — Ambulatory Visit (INDEPENDENT_AMBULATORY_CARE_PROVIDER_SITE_OTHER): Payer: 59 | Admitting: Family Medicine

## 2014-05-17 VITALS — BP 114/58 | HR 76 | Wt 194.0 lb

## 2014-05-17 DIAGNOSIS — F411 Generalized anxiety disorder: Secondary | ICD-10-CM

## 2014-05-17 MED ORDER — PROPRANOLOL HCL 10 MG PO TABS: 10.0000 mg | ORAL_TABLET | Freq: Three times a day (TID) | ORAL | Status: DC | PRN

## 2014-05-17 MED ORDER — SERTRALINE HCL 50 MG PO TABS: ORAL_TABLET | ORAL | Status: DC

## 2014-05-17 NOTE — Progress Notes (Signed)
   Subjective:    Patient ID: Raymond Stewart, male    DOB: [birth year], 33 y.o.   MRN: [id number]  HPI Here to discuss anxiety symptoms. He complains of difficulty sleeping and feeling nervous and on edge more than half the days. Feels like he worries excessively and has trouble relaxing. 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 Will get sweaty and feel like a cloud was over his head and cheeks flushed.  Has had a few conflicts at work.    Review of Systems     Objective:   Physical Exam  Constitutional: He is oriented to person, place, and time. He appears well-developed and well-nourished.  HENT:  Head: Normocephalic and atraumatic.  Neurological: He is alert and oriented to person, place, and time.  Skin: Skin is warm and dry.  Psychiatric: He has a normal mood and affect. His behavior is normal.          Assessment & Plan:  Generalized anxiety disorder-gad 7 score of 15 which is severe. He rates his symptoms as very difficult. We discussed option of overall daily tx to control sxs vs counseling. Discussed role of SSRIs in tx and recommended length of tx.  Will start sertraine. Discussed potential side effects.  F/U in 3-4 weeks.    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 Can try 1 or 2 tabs.  F/U in 1 mo  Time spent 30 min, > 50% spent counseling about GAD and phobia of speaking in groups, etc.

## 2014-06-07 ENCOUNTER — Encounter: Payer: Self-pay | Admitting: Family Medicine

## 2014-06-07 ENCOUNTER — Ambulatory Visit (INDEPENDENT_AMBULATORY_CARE_PROVIDER_SITE_OTHER): Payer: 59 | Admitting: Family Medicine

## 2014-06-07 VITALS — BP 105/55 | HR 72 | Wt 197.0 lb

## 2014-06-07 DIAGNOSIS — H9190 Unspecified hearing loss, unspecified ear: Secondary | ICD-10-CM

## 2014-06-07 DIAGNOSIS — F40248 Other situational type phobia: Secondary | ICD-10-CM

## 2014-06-07 DIAGNOSIS — H9191 Unspecified hearing loss, right ear: Secondary | ICD-10-CM

## 2014-06-07 DIAGNOSIS — F411 Generalized anxiety disorder: Secondary | ICD-10-CM

## 2014-06-07 DIAGNOSIS — F4010 Social phobia, unspecified: Secondary | ICD-10-CM

## 2014-06-07 NOTE — Progress Notes (Signed)
   Subjective:    Patient ID: Raymond Stewart, male    DOB: 1981-10-08, 33 y.o.   MRN: [id number]  HPI F/U GAD - feels like can let things go moe easily. Waking up with HA and going to bed with HA.  Sleeping well overall.  Still feels nervous and on edge several days a week and has trouble relaxing several days a week. Does still complain of some irritability more than half the days. He rates his score and symptoms are somewhat difficult.  Fear of public speaking - The propranolol worked very well for public speaking.  He reduce his anxiety and sweating.  He also wants me to check his right ear today. He felt his hearing screen at work and went to urgent care. He irrigated his ear and noticed some evidence of inflamed eardrum. Put him on antibiotic. He wants to make sure that his ear is improved before he repeats his hearing screen at work. He's not had any pain fever or discharge from the ear.  Review of Systems No CP or SOB.      Objective:   Physical Exam  Constitutional: He is oriented to person, place, and time. He appears well-developed and well-nourished.  HENT:  Head: Normocephalic and atraumatic.  Right Ear: External ear normal.  Left Ear: External ear normal.  Right and left TMs are clear.   Cardiovascular: Normal rate, regular rhythm and normal heart sounds.   Pulmonary/Chest: Effort normal and breath sounds normal.  Neurological: He is alert and oriented to person, place, and time.  Skin: Skin is warm and dry.  Psychiatric: He has a normal mood and affect. His behavior is normal.          Assessment & Plan:  Generalized anxiety disorder-gad 7 score of 5 today. Previous was 15. Significant improvement over 3 weeks. We'll continue his current regimen and followup in one month. He has been getting some daily headaches which are mild and he can usually treated with Tylenol. Though, if the headaches persist over the next 3 weeks then please call and let me know and we will  change the medication.  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 Continue current regimen.  Hearing disturbance, right ear-exam today is normal and reassuring. We did to 40 dB hearing screen to make sure that he can hear okay before going to do his repeat hearing screen at work. He did pass today. If he has any problems then please let me know and we can refer to ENT for further evaluation.

## 2014-07-05 ENCOUNTER — Ambulatory Visit: Payer: 59 | Admitting: Family Medicine

## 2014-07-16 ENCOUNTER — Ambulatory Visit: Payer: 59 | Admitting: Family Medicine

## 2014-07-25 ENCOUNTER — Other Ambulatory Visit: Payer: Self-pay | Admitting: Family Medicine

## 2014-07-26 ENCOUNTER — Encounter: Payer: Self-pay | Admitting: Emergency Medicine

## 2014-07-26 ENCOUNTER — Emergency Department (INDEPENDENT_AMBULATORY_CARE_PROVIDER_SITE_OTHER): Payer: 59

## 2014-07-26 ENCOUNTER — Emergency Department (INDEPENDENT_AMBULATORY_CARE_PROVIDER_SITE_OTHER)
Admission: EM | Admit: 2014-07-26 | Discharge: 2014-07-26 | Disposition: A | Discharge status: Home / Self Care | Payer: 59 | Source: Home / Self Care | Attending: Emergency Medicine | Admitting: Emergency Medicine

## 2014-07-26 DIAGNOSIS — S93402A Sprain of unspecified ligament of left ankle, initial encounter: Secondary | ICD-10-CM

## 2014-07-26 DIAGNOSIS — S93409A Sprain of unspecified ligament of unspecified ankle, initial encounter: Secondary | ICD-10-CM

## 2014-07-26 DIAGNOSIS — M25473 Effusion, unspecified ankle: Secondary | ICD-10-CM

## 2014-07-26 DIAGNOSIS — S93602A Unspecified sprain of left foot, initial encounter: Secondary | ICD-10-CM

## 2014-07-26 DIAGNOSIS — M25476 Effusion, unspecified foot: Secondary | ICD-10-CM

## 2014-07-26 DIAGNOSIS — S93609A Unspecified sprain of unspecified foot, initial encounter: Secondary | ICD-10-CM

## 2014-07-26 MED ORDER — HYDROCODONE-ACETAMINOPHEN 5-325 MG PO TABS: 1.0000 | ORAL_TABLET | ORAL | Status: DC | PRN

## 2014-07-26 NOTE — ED Notes (Signed)
Patient reports rolling his left foot and ankle last evening; has iced, wrapped and elevated it. Took Vicodin this a.m.

## 2014-07-26 NOTE — ED Provider Notes (Signed)
CSN: [id number]     Arrival date & time 07/26/14  1100 History   First MD Initiated Contact with Patient 07/26/14 1110     Chief Complaint  Patient presents with  . Ankle Pain  . Foot Pain    HPI Patient reports rolling his left foot and ankle last evening while playing basketball.; has iced, wrapped and elevated it. Took Vicodin this a.m. + Moderate to severe sharp Pain, moderate swelling No numbness or tingling or weakness, but pain is exacerbated by inversion and eversion and dorsiflexion.  Past Medical History  Diagnosis Date  . Smoker 09/04/2012  . ANKLE PAIN, RIGHT 09/04/2010    Qualifier: Diagnosis of  By: Orson Aloe MD, Tinnie Gens     Past Surgical History  Procedure Laterality Date  . Right knee bone spurs  6-00  . Left hand  8-08   Family History  Problem Relation Age of Onset  . Hypertension Father   . Heart disease Father   . Heart attack Father   . Cancer Other     lung  . Diabetes Other    History  Substance Use Topics  . Smoking status: Current Every Day Smoker -- 0.50 packs/day for 12 years  . Smokeless tobacco: Not on file  . Alcohol Use: No    Review of Systems  All other systems reviewed and are negative.   Allergies  Review of patient's allergies indicates no known allergies.  Home Medications   Prior to Admission medications   Medication Sig Start Date End Date Taking? Authorizing Provider  HYDROcodone-acetaminophen (NORCO/VICODIN) 5-325 MG per tablet Take 1-2 tablets by mouth every 4 (four) hours as needed for severe pain. Take with food. 07/26/14   Lajean Manes, MD  propranolol (INDERAL) 10 MG tablet Take 1-2 tablets (10-20 mg total) by mouth 3 (three) times daily as needed. For public speaking 05/17/14   Agapito Games, MD  sertraline (ZOLOFT) 50 MG tablet TAKE 1/2 TAB DAILY X 1 WEEK, THEN INCREASE TO WHOLE TAB.    Agapito Games, MD   BP 100/67  Pulse 90  Temp(Src) 97.6 F (36.4 C) (Oral)  Resp 16  Ht 6\' 2"  (1.88 m)  Wt 194 lb  (87.998 kg)  BMI 24.90 kg/m2  SpO2 98% Physical Exam  Nursing note and vitals reviewed. Constitutional: He is oriented to person, place, and time. He appears well-developed and well-nourished. No distress.  Uncomfortable from left ankle and foot pain  HENT:  Head: Normocephalic and atraumatic.  Eyes: Conjunctivae and EOM are normal. Pupils are equal, round, and reactive to light. No scleral icterus.  Neck: Normal range of motion.  Cardiovascular: Normal rate.   Pulmonary/Chest: Effort normal.  Abdominal: He exhibits no distension.  Musculoskeletal:       Left ankle: He exhibits decreased range of motion and swelling. He exhibits no ecchymosis, no deformity, no laceration and normal pulse. Tenderness. Lateral malleolus, medial malleolus and AITFL tenderness found. No head of 5th metatarsal and no proximal fibula tenderness found. Achilles tendon normal. Achilles tendon exhibits normal Thompson's test results.       Left foot: He exhibits decreased range of motion, tenderness and bony tenderness. He exhibits normal capillary refill, no deformity and no laceration.       Feet:  Tenderness and swelling as depicted.  No ligamentous instability of left ankle or foot.  Pain exacerbated by inversion eversion and dorsi flexion.  Neurovascular distally intact  Neurological: He is alert and oriented to person, place, and time.  Skin: Skin is warm.  Psychiatric: He has a normal mood and affect.    ED Course  Procedures (including critical care time) Labs Review Labs Reviewed - No data to display  Imaging Review Dg Ankle Complete Left  07/26/2014   CLINICAL DATA:  Trauma and pain.  EXAM: LEFT ANKLE COMPLETE - 3+ VIEW; LEFT FOOT - COMPLETE 3+ VIEW  COMPARISON:  None.  FINDINGS: Three views of the left foot demonstrate no fracture or dislocation.  Three views of the left ankle demonstrate moderate lateral soft tissue swelling. No acute fracture or dislocation. Base of fifth metatarsal and  talar dome intact. Mild degenerative irregularity involving the medial tibiotalar articulation.  IMPRESSION: Lateral soft tissue swelling only.   Electronically Signed   By: Jeronimo GreavesKyle  Talbot M.D.   On: 07/26/2014 12:16   Dg Foot Complete Left  07/26/2014   CLINICAL DATA:  Trauma and pain.  EXAM: LEFT ANKLE COMPLETE - 3+ VIEW; LEFT FOOT - COMPLETE 3+ VIEW  COMPARISON:  None.  FINDINGS: Three views of the left foot demonstrate no fracture or dislocation.  Three views of the left ankle demonstrate moderate lateral soft tissue swelling. No acute fracture or dislocation. Base of fifth metatarsal and talar dome intact. Mild degenerative irregularity involving the medial tibiotalar articulation.  IMPRESSION: Lateral soft tissue swelling only.   Electronically Signed   By: Jeronimo GreavesKyle  Talbot M.D.   On: 07/26/2014 12:16     MDM   1. Left ankle sprain, initial encounter   2. Foot sprain, left, initial encounter    likely grade 2+ left foot and ankle sprain No fracture or dislocation x-rays of left foot and ankle  Treatment options discussed, as well as risks, benefits, alternatives. Patient voiced understanding and agreement with the following plans: Ace bandage applied. Encourage rest, ice, compression with ACE bandage, and elevation of injured body part. He declined our placing left foot and ankle splint, as he has one at home. He'll use crutches for the next week . Motrin 800 mg 3 times a day with food when necessary moderate pain. A small prescription for Vicodin when necessary severe pain. Note written to excuse from work today. Only sit down work if available at work x1 week Followup with Dr. Benjamin Stainhekkekandam, sports medicine 1 week  Precautions discussed. Red flags discussed. Questions invited and answered. Patient voiced understanding and agreement.      Lajean Manesavid Massey, MD 07/26/14 1248

## 2014-08-03 ENCOUNTER — Encounter: Payer: Self-pay | Admitting: Family Medicine

## 2014-08-03 ENCOUNTER — Ambulatory Visit (INDEPENDENT_AMBULATORY_CARE_PROVIDER_SITE_OTHER): Payer: 59 | Admitting: Family Medicine

## 2014-08-03 ENCOUNTER — Ambulatory Visit (INDEPENDENT_AMBULATORY_CARE_PROVIDER_SITE_OTHER): Payer: 59

## 2014-08-03 VITALS — BP 117/73 | HR 76 | Wt 194.0 lb

## 2014-08-03 DIAGNOSIS — M25476 Effusion, unspecified foot: Secondary | ICD-10-CM

## 2014-08-03 DIAGNOSIS — M25572 Pain in left ankle and joints of left foot: Secondary | ICD-10-CM

## 2014-08-03 DIAGNOSIS — F411 Generalized anxiety disorder: Secondary | ICD-10-CM

## 2014-08-03 DIAGNOSIS — M25579 Pain in unspecified ankle and joints of unspecified foot: Secondary | ICD-10-CM

## 2014-08-03 DIAGNOSIS — M25473 Effusion, unspecified ankle: Secondary | ICD-10-CM

## 2014-08-03 DIAGNOSIS — Z13220 Encounter for screening for lipoid disorders: Secondary | ICD-10-CM

## 2014-08-03 MED ORDER — SERTRALINE HCL 50 MG PO TABS: 50.0000 mg | ORAL_TABLET | Freq: Every day | ORAL | Status: DC

## 2014-08-03 NOTE — Progress Notes (Signed)
   Subjective:    Patient ID: Raymond LimerickRobert W Gorelik, male    DOB: February 28, 1981, 33 y.o.   MRN: [id number]  HPI GAD- Still not sleeping well.  Feels like mood is still well controlled.  The HA have resolved. Still having some anxiety but just some days.   Rolled his left ankle playing basketball about 8 days ago.   Went to UC and told not fracture.  Still swelling.  He is not wearing a split or brace.  No ACE wrap.  Says it feels different. He rolled his right ankle not that long ago and says even then his swelling went down much more quickly. He has continue to work and does have pain with walking on it.    Review of Systems     Objective:   Physical Exam  Constitutional: He appears well-developed and well-nourished.  HENT:  Head: Normocephalic and atraumatic.  Musculoskeletal:  Right ankle is swollen laterally. Some trace edema medially. He is tender over the posterior edge of the lateral epicondyles and he's tender over the medial epicondyle. Is tender just above the lateral upper condyle as well. Strength is 5 out of 5 in all directions. Pain anteriorly with anterior drawer test.  Skin: Skin is warm and dry.  Psychiatric: He has a normal mood and affect. His behavior is normal.          Assessment & Plan:

## 2014-11-03 ENCOUNTER — Ambulatory Visit: Payer: 59 | Admitting: Family Medicine

## 2014-11-05 ENCOUNTER — Ambulatory Visit (INDEPENDENT_AMBULATORY_CARE_PROVIDER_SITE_OTHER): Payer: 59 | Admitting: Sports Medicine

## 2014-11-05 ENCOUNTER — Encounter: Payer: Self-pay | Admitting: Sports Medicine

## 2014-11-05 VITALS — BP 129/72 | HR 97 | Ht 74.0 in | Wt 192.0 lb

## 2014-11-05 DIAGNOSIS — M160 Bilateral primary osteoarthritis of hip: Secondary | ICD-10-CM

## 2014-11-05 DIAGNOSIS — F411 Generalized anxiety disorder: Secondary | ICD-10-CM

## 2014-11-05 MED ORDER — SERTRALINE HCL 100 MG PO TABS: 100.0000 mg | ORAL_TABLET | Freq: Every day | ORAL | Status: DC

## 2014-11-05 MED ORDER — TRAZODONE HCL 50 MG PO TABS: 50.0000 mg | ORAL_TABLET | Freq: Every day | ORAL | Status: DC

## 2014-11-05 NOTE — Progress Notes (Signed)
  Subjective:    CC: Follow-up  HPI: Generalized anxiety disorder: Saw his PCP in approximately 3 months ago, diagnosed with generalized anxiety and started on Zoloft 50. Overall feels a little better. He does continue to report significant difficulty staying asleep and getting to sleep, poor energy, moderate difficulty concentrating, but very mild depressed mood, no suicidal or homicidal ideation, he also endorses severe irritability, and moderate worrying, nervousness. His principle symptom he tells me his inability to sleep.  Right hip osteoarthritis: X-ray confirmed, last injection was approximately 1 year and 2 months ago, he desires a repeat injection today. Pain is moderate, persistent, localized in the groin without radiation.  Past medical history, Surgical history, Family history not pertinant except as noted below, Social history, Allergies, and medications have been entered into the medical record, reviewed, and no changes needed.   Review of Systems: No fevers, chills, night sweats, weight loss, chest pain, or shortness of breath.   Objective:    General: Well Developed, well nourished, and in no acute distress.  Neuro: Alert and oriented x3, extra-ocular muscles intact, sensation grossly intact.  HEENT: Normocephalic, atraumatic, pupils equal round reactive to light, neck supple, no masses, no lymphadenopathy, thyroid nonpalpable.  Skin: Warm and dry, no rashes. Cardiac: Regular rate and rhythm, no murmurs rubs or gallops, no lower extremity edema.  Respiratory: Clear to auscultation bilaterally. Not using accessory muscles, speaking in full sentences.  Procedure: Real-time Ultrasound Guided Injection of right femoral acetabular joint Device: GE Logiq E  Verbal informed consent obtained.  Time-out conducted.  Noted no overlying erythema, induration, or other signs of local infection.  Skin prepped in a sterile fashion.  Local anesthesia: Topical Ethyl chloride.  With  sterile technique and under real time ultrasound guidance: Spinal needle advanced into the femoral head/neck junction, 2 mL kenalog 40, 4 mL lidocaine injected easily.  Completed without difficulty  Pain immediately resolved suggesting accurate placement of the medication.  Advised to call if fevers/chills, erythema, induration, drainage, or persistent bleeding.  Images permanently stored and available for review in the ultrasound unit.  Impression: Technically successful ultrasound guided injection.  Impression and Recommendations:

## 2014-11-05 NOTE — Assessment & Plan Note (Addendum)
Increasing Zoloft to 100 mg daily. Adding trazodone 50 mg at bedtime to help with sleep. Return in 6 weeks. PHQ9 and GAD7 at each visit to track score

## 2014-11-05 NOTE — Assessment & Plan Note (Signed)
Last injection was 14 months ago. Good response. Repeat femoral acetabular injection as above.

## 2014-11-15 ENCOUNTER — Emergency Department (INDEPENDENT_AMBULATORY_CARE_PROVIDER_SITE_OTHER): Payer: 59

## 2014-11-15 ENCOUNTER — Emergency Department
Admission: EM | Admit: 2014-11-15 | Discharge: 2014-11-15 | Disposition: A | Discharge status: Home / Self Care | Payer: 59 | Source: Home / Self Care | Attending: Family Medicine | Admitting: Family Medicine

## 2014-11-15 ENCOUNTER — Encounter: Payer: Self-pay | Admitting: Emergency Medicine

## 2014-11-15 DIAGNOSIS — S99912A Unspecified injury of left ankle, initial encounter: Secondary | ICD-10-CM

## 2014-11-15 DIAGNOSIS — S93402A Sprain of unspecified ligament of left ankle, initial encounter: Secondary | ICD-10-CM

## 2014-11-15 DIAGNOSIS — M25572 Pain in left ankle and joints of left foot: Secondary | ICD-10-CM

## 2014-11-15 MED ORDER — MELOXICAM 15 MG PO TABS: 15.0000 mg | ORAL_TABLET | Freq: Every day | ORAL | Status: DC

## 2014-11-15 NOTE — ED Provider Notes (Signed)
CSN: [id number]     Arrival date & time 11/15/14  1427 History   First MD Initiated Contact with Patient 11/15/14 1618     Chief Complaint  Patient presents with  . Fall      HPI Comments: While playing basketball yesterday, patient inverted his left foot during a fall.  He also twisted his neck, but states that he has minimal discomfort in his neck, and no neurologic symptoms. He had a similar left ankle injury earlier this year treated with a brace.  Patient is a 33 y.o. male presenting with ankle pain. The history is provided by the patient.  Ankle Pain Location:  Ankle Time since incident:  1 day Injury: yes   Mechanism of injury comment:  Fall during basketball game Ankle location:  L ankle Pain details:    Quality:  Aching   Radiates to:  Does not radiate   Severity:  Moderate   Onset quality:  Sudden   Duration:  1 day   Timing:  Constant   Progression:  Unchanged Chronicity:  Recurrent Dislocation: no   Relieved by:  Nothing Worsened by:  Bearing weight Ineffective treatments:  None tried Associated symptoms: decreased ROM, neck pain, stiffness and swelling   Associated symptoms: no back pain, no fatigue, no itching, no numbness and no tingling     Past Medical History  Diagnosis Date  . Smoker 09/04/2012  . ANKLE PAIN, RIGHT 09/04/2010    Qualifier: Diagnosis of  By: Orson AloeHenderson MD, Tinnie GensJeffrey     Past Surgical History  Procedure Laterality Date  . Right knee bone spurs  6-00  . Left hand  8-08   Family History  Problem Relation Age of Onset  . Hypertension Father   . Heart disease Father   . Heart attack Father   . Cancer Other     lung  . Diabetes Other    History  Substance Use Topics  . Smoking status: Current Every Day Smoker -- 0.50 packs/day for 12 years  . Smokeless tobacco: Not on file  . Alcohol Use: No    Review of Systems  Constitutional: Negative for fatigue.  Musculoskeletal: Positive for stiffness and neck pain. Negative for back pain.   Skin: Negative for itching.  Neurological: Negative for weakness and numbness.    Allergies  Review of patient's allergies indicates not on file.  Home Medications   Prior to Admission medications   Medication Sig Start Date End Date Taking? Authorizing Provider  meloxicam (MOBIC) 15 MG tablet Take 1 tablet (15 mg total) by mouth daily. Take with food each morning 11/15/14   Lattie HawStephen A Beese, MD  propranolol (INDERAL) 10 MG tablet Take 1-2 tablets (10-20 mg total) by mouth 3 (three) times daily as needed. For public speaking 05/17/14   Agapito Gamesatherine D Metheney, MD  sertraline (ZOLOFT) 100 MG tablet Take 1 tablet (100 mg total) by mouth daily. 11/05/14   Monica Bectonhomas J Thekkekandam, MD  traZODone (DESYREL) 50 MG tablet Take 1 tablet (50 mg total) by mouth at bedtime. 11/05/14   Monica Bectonhomas J Thekkekandam, MD   BP 116/75 mmHg  Pulse 60  Temp(Src) 98 F (36.7 C) (Oral)  Ht 6\' 2"  (1.88 m)  Wt 194 lb (87.998 kg)  BMI 24.90 kg/m2  SpO2 100% Physical Exam  Constitutional: He is oriented to person, place, and time. He appears well-developed and well-nourished. No distress.  HENT:  Head: Atraumatic.  Eyes: Pupils are equal, round, and reactive to light.  Musculoskeletal: He exhibits  no edema.       Left ankle: He exhibits decreased range of motion. He exhibits no swelling, no ecchymosis, no deformity, no laceration and normal pulse. Tenderness. Lateral malleolus, CF ligament and posterior TFL tenderness found. No medial malleolus, no AITFL, no head of 5th metatarsal and no proximal fibula tenderness found. Achilles tendon normal.       Cervical back: He exhibits decreased range of motion. He exhibits no tenderness, no bony tenderness, no swelling, no pain and no spasm.  Neck has mild decreased range of motion to left, but is nontender to palpation.  Distal neurovascular function is intact.   Neurological: He is alert and oriented to person, place, and time.  Skin: Skin is warm and dry.  Nursing note and  vitals reviewed.   ED Course  Procedures  none    Imaging Review Dg Ankle Complete Left  11/15/2014   CLINICAL DATA:  Left ankle pain status post twisting injury while playing basketball. Pain over the lateral malleolus. Initial encounter  EXAM: LEFT ANKLE COMPLETE - 3+ VIEW  COMPARISON:  08/03/2014  FINDINGS: There is no evidence of fracture, dislocation, or joint effusion. There is no evidence of arthropathy or other focal bone abnormality. Soft tissues are unremarkable.  IMPRESSION: No acute osseous injury of the left ankle.   Electronically Signed   By: Elige KoHetal  Patel   On: 11/15/2014 16:58     MDM   1. Left ankle sprain, initial encounter   2. Ankle injury, left, initial encounter       Rx for Mobic 15mg  Apply ice pack for 30 minutes every 1 to 2 hours today and tomorrow.  Elevate.  Use crutches for 3 to 5 days.  Wear Ace wrap until swelling decreases.  Wear brace for about 2 to 3 weeks (already has brace at home).  Begin range of motion and stretching exercises in about 5 days as per instruction sheet.  Followup with Dr. Rodney Langtonhomas Thekkekandam ([facility]) if not improving about two weeks.     Lattie HawStephen A Beese, MD 11/18/14 1500

## 2014-11-15 NOTE — ED Notes (Signed)
Fall yesterday during rebound in basketball game twisted Left Ankle fell backwards and head snapped forward, hurts especially to turn neck to left and to look down

## 2014-11-15 NOTE — Discharge Instructions (Signed)
Apply ice pack for 30 minutes every 1 to 2 hours today and tomorrow.  Elevate.  Use crutches for 3 to 5 days.  Wear Ace wrap until swelling decreases.  Wear brace for about 2 to 3 weeks.  Begin range of motion and stretching exercises in about 5 days as per instruction sheet. ° °

## 2014-12-20 ENCOUNTER — Ambulatory Visit: Payer: 59 | Admitting: Sports Medicine

## 2015-02-25 ENCOUNTER — Encounter: Payer: Self-pay | Admitting: Family Medicine

## 2015-02-25 ENCOUNTER — Ambulatory Visit (INDEPENDENT_AMBULATORY_CARE_PROVIDER_SITE_OTHER): Payer: 59 | Admitting: Family Medicine

## 2015-02-25 VITALS — BP 133/81 | HR 65 | Wt 194.0 lb

## 2015-02-25 DIAGNOSIS — G43809 Other migraine, not intractable, without status migrainosus: Secondary | ICD-10-CM | POA: Diagnosis not present

## 2015-02-25 DIAGNOSIS — L989 Disorder of the skin and subcutaneous tissue, unspecified: Secondary | ICD-10-CM | POA: Diagnosis not present

## 2015-02-25 MED ORDER — PROPRANOLOL HCL 10 MG PO TABS: 20.0000 mg | ORAL_TABLET | Freq: Two times a day (BID) | ORAL | Status: DC

## 2015-02-25 NOTE — Progress Notes (Signed)
CC: Raymond Stewart is a 34 y.o. male is here for Headache   Subjective: HPI:  Complains of growth on the left side of his face underneath the eye that has been present for a few weeks now. Seems to be getting larger on a weekly basis. It's painless but now is in his field of vision. No interventions as of yet. He's never had this before. No skin lesions elsewhere  Complains of left-sided headache that has been present on a daily basis for the last 3 weeks. It starts off with a tapping sensation in the left temple and then becomes a left-sided pounding headache to a moderate degree that is improved with closing his eyes or going to a dark room. He's had this before in the past but was years ago. Nothing seems to trigger these headaches. No interventions as of yet other than above. Denies any motor or sensory disturbances prior during or after these episodes. It seems to go away after a few hours of just trying to ignore it. It fluctuates throughout the day. Denies any fevers, chills, visual disturbance other than that described above. No sore throat, confusion or dizziness. He ran out of propranolol about a week prior to these episodes happening   Review Of Systems Outlined In HPI  Past Medical History  Diagnosis Date  . Smoker 09/04/2012  . ANKLE PAIN, RIGHT 09/04/2010    Qualifier: Diagnosis of  By: Orson Aloe MD, Tinnie Gens      Past Surgical History  Procedure Laterality Date  . Right knee bone spurs  6-00  . Left hand  8-08   Family History  Problem Relation Age of Onset  . Hypertension Father   . Heart disease Father   . Heart attack Father   . Cancer Other     lung  . Diabetes Other     History   Social History  . Marital Status: Married    Spouse Name: N/A  . Number of Children: N/A  . Years of Education: N/A   Occupational History  . Not on file.   Social History Main Topics  . Smoking status: Current Every Day Smoker -- 0.50 packs/day for 12 years  . Smokeless  tobacco: Not on file  . Alcohol Use: No  . Drug Use: No  . Sexual Activity: Yes   Other Topics Concern  . Not on file   Social History Narrative     Objective: BP 133/81 mmHg  Pulse 65  Wt 194 lb (87.998 kg)  General: Alert and Oriented, No Acute Distress HEENT: Pupils equal, round, reactive to light. Conjunctivae clear.  External ears unremarkable, canals clear with intact TMs with appropriate landmarks.  Middle ear appears open without effusion. Pink inferior turbinates.  Moist mucous membranes, pharynx without inflammation nor lesions.  Neck supple without palpable lymphadenopathy nor abnormal masses. Lungs: Clear comfortable work of breathing Cardiac: Regular rate and rhythm.  Cranial nerves II through XII grossly intact Extremities: No peripheral edema.  Strong peripheral pulses.  Mental Status: No depression, anxiety, nor agitation. Skin: Warm and dry. Wart underneath the left eye lid  Assessment & Plan: Raymond Stewart was seen today for headache.  Diagnoses and all orders for this visit:  Face lesion Orders: -     Ambulatory referral to Dermatology  Other migraine without status migrainosus, not intractable Orders: -     propranolol (INDERAL) 10 MG tablet; Take 2 tablets (20 mg total) by mouth 2 (two) times daily. To help prevent migraine.  Facial lesion appears to be a wart, given that it's basically on his lower eyelid I do not want to do cryotherapy today, referral to dermatology Migraine: I suspect this developed after he stopped taking propranolol therefore restart propranolol and if no benefit call me next week and step up would be adding Topamax.  Return if symptoms worsen or fail to improve.

## 2015-03-28 ENCOUNTER — Encounter: Payer: Self-pay | Admitting: Physician Assistant

## 2015-03-28 ENCOUNTER — Ambulatory Visit (INDEPENDENT_AMBULATORY_CARE_PROVIDER_SITE_OTHER): Payer: 59 | Admitting: Physician Assistant

## 2015-03-28 VITALS — BP 116/72 | HR 98 | Wt 193.0 lb

## 2015-03-28 DIAGNOSIS — S0993XA Unspecified injury of face, initial encounter: Secondary | ICD-10-CM | POA: Insufficient documentation

## 2015-03-28 DIAGNOSIS — S0993XD Unspecified injury of face, subsequent encounter: Secondary | ICD-10-CM

## 2015-03-28 DIAGNOSIS — IMO0002 Reserved for concepts with insufficient information to code with codable children: Secondary | ICD-10-CM | POA: Insufficient documentation

## 2015-03-28 MED ORDER — HYDROCODONE-ACETAMINOPHEN 7.5-325 MG PO TABS
1.0000 | ORAL_TABLET | Freq: Three times a day (TID) | ORAL | Status: DC | PRN
Start: 2015-03-28 — End: 2015-08-26

## 2015-03-28 NOTE — Progress Notes (Signed)
   Subjective:    Patient ID: Raymond Stewart, male    DOB: 11/11/1981, 34 y.o.   MRN: [id number]  HPI Patient is a 34 year old male who presents to the clinic to follow-up on chin laceration that occurred yesterday while playing basketball. He did go to the hospital and have his 3 cm laceration sutured. They sent him home with 800 mg ibuprofen. He is still having a considerable amount of pain. There is pain with opening and closing his mouth. He does not feel like the ibuprofen is helping like it should. He is concerned for fracture. No x-rays were done in the hospital.   Review of Systems  All other systems reviewed and are negative.      Objective:   Physical Exam  Constitutional: He is oriented to person, place, and time. He appears well-developed and well-nourished.  HENT:  Head: Normocephalic.    Neck:    Cardiovascular: Normal rate, regular rhythm and normal heart sounds.   Pulmonary/Chest: Effort normal and breath sounds normal.  Neurological: He is alert and oriented to person, place, and time.  Psychiatric: He has a normal mood and affect. His behavior is normal.          Assessment & Plan:   Chin laceration- I do think most the patient's pain is coming from soft tissue swelling. I reassured him I did not think there was a fracture. I did give him Norco short supply for the next day to alternate with ibuprofen. I did encourage icing to help decrease swelling. Continue to work on range of motion to keep jaw loose. The next 7 days, and for suture removal.

## 2015-03-30 ENCOUNTER — Ambulatory Visit: Payer: Self-pay | Admitting: Sports Medicine

## 2015-04-15 ENCOUNTER — Ambulatory Visit (INDEPENDENT_AMBULATORY_CARE_PROVIDER_SITE_OTHER): Payer: 59 | Admitting: Sports Medicine

## 2015-04-15 VITALS — BP 120/75 | HR 80 | Ht 74.0 in | Wt 190.0 lb

## 2015-04-15 DIAGNOSIS — M160 Bilateral primary osteoarthritis of hip: Secondary | ICD-10-CM

## 2015-04-15 DIAGNOSIS — H9191 Unspecified hearing loss, right ear: Secondary | ICD-10-CM | POA: Diagnosis not present

## 2015-04-15 MED ORDER — MELOXICAM 15 MG PO TABS: ORAL_TABLET | ORAL | Status: DC

## 2015-04-15 NOTE — Progress Notes (Signed)
  Subjective:    CC: Hip pain  HPI: This is a pleasant 34 year old male with early bilateral hip osteoarthritis, he is doing extremely well with prior femoral acetabular injections on the right side, injections have typically lasted over one year. He starting to have a recurrence of pain on the right, intermittent, but is not taking any NSAIDs. Pain is mild, localized in the groin without radiation. Amenable to try NSAIDs first.  He also had some concerns returns regarding decreased hearing in the right ear. Several times he is had to have the ears flushed due to cerumen impactions however there is no cerumen today.  Past medical history, Surgical history, Family history not pertinant except as noted below, Social history, Allergies, and medications have been entered into the medical record, reviewed, and no changes needed.   Review of Systems: No fevers, chills, night sweats, weight loss, chest pain, or shortness of breath.   Objective:    General: Well Developed, well nourished, and in no acute distress.  Neuro: Alert and oriented x3, extra-ocular muscles intact, sensation grossly intact.  HEENT: Normocephalic, atraumatic, pupils equal round reactive to light, neck supple, no masses, no lymphadenopathy, thyroid nonpalpable. Oropharynx, nasopharynx, ear canals are unremarkable with the exception of some tympanosclerosis on the right drum  Skin: Warm and dry, no rashes. Cardiac: Regular rate and rhythm, no murmurs rubs or gallops, no lower extremity edema.  Respiratory: Clear to auscultation bilaterally. Not using accessory muscles, speaking in full sentences. Right Hip: ROM IR: 60 Deg, ER: 60 Deg, Flexion: 120 Deg, Extension: 100 Deg, Abduction: 45 Deg, Adduction: 45 Deg Strength IR: 5/5, ER: 5/5, Flexion: 5/5, Extension: 5/5, Abduction: 5/5, Adduction: 5/5 Pelvic alignment unremarkable to inspection and palpation. Standing hip rotation and gait without trendelenburg /  unsteadiness. Greater trochanter without tenderness to palpation. No tenderness over piriformis. No SI joint tenderness and normal minimal SI movement.  Impression and Recommendations:

## 2015-04-15 NOTE — Assessment & Plan Note (Signed)
Referral to ENT. Exam is benign with the exception of some tympanosclerosis.

## 2015-04-15 NOTE — Assessment & Plan Note (Signed)
Has done extremely well with femoral acetabular joint injections. The previous injection was approximately 6 months ago. Pain is only minimal, I'm going to add meloxicam to use daily, if insufficient response, then we can do a repeat right femoral acetabular injection.

## 2015-08-20 IMAGING — CR DG HIP COMPLETE 2+V*R*
4 series · 4 of 4 positions shown · non-contrast
Comparison: None.

CLINICAL DATA: Right groin and leg pain. No known injury.

EXAM:
RIGHT HIP - COMPLETE 2+ VIEW

[view not recorded (1 of 4)]
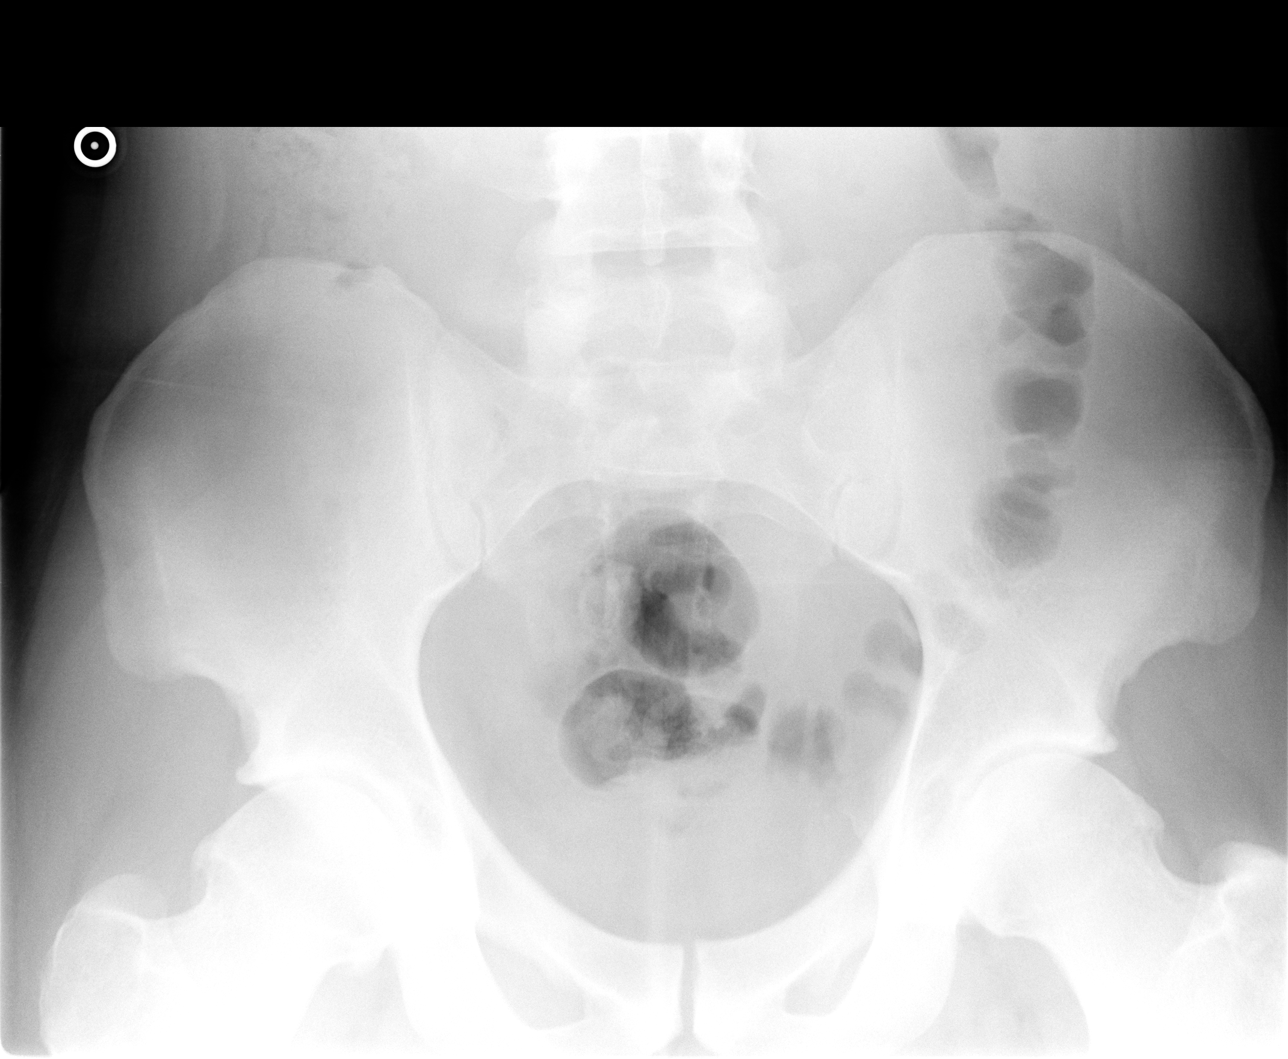

[view not recorded (2 of 4)]
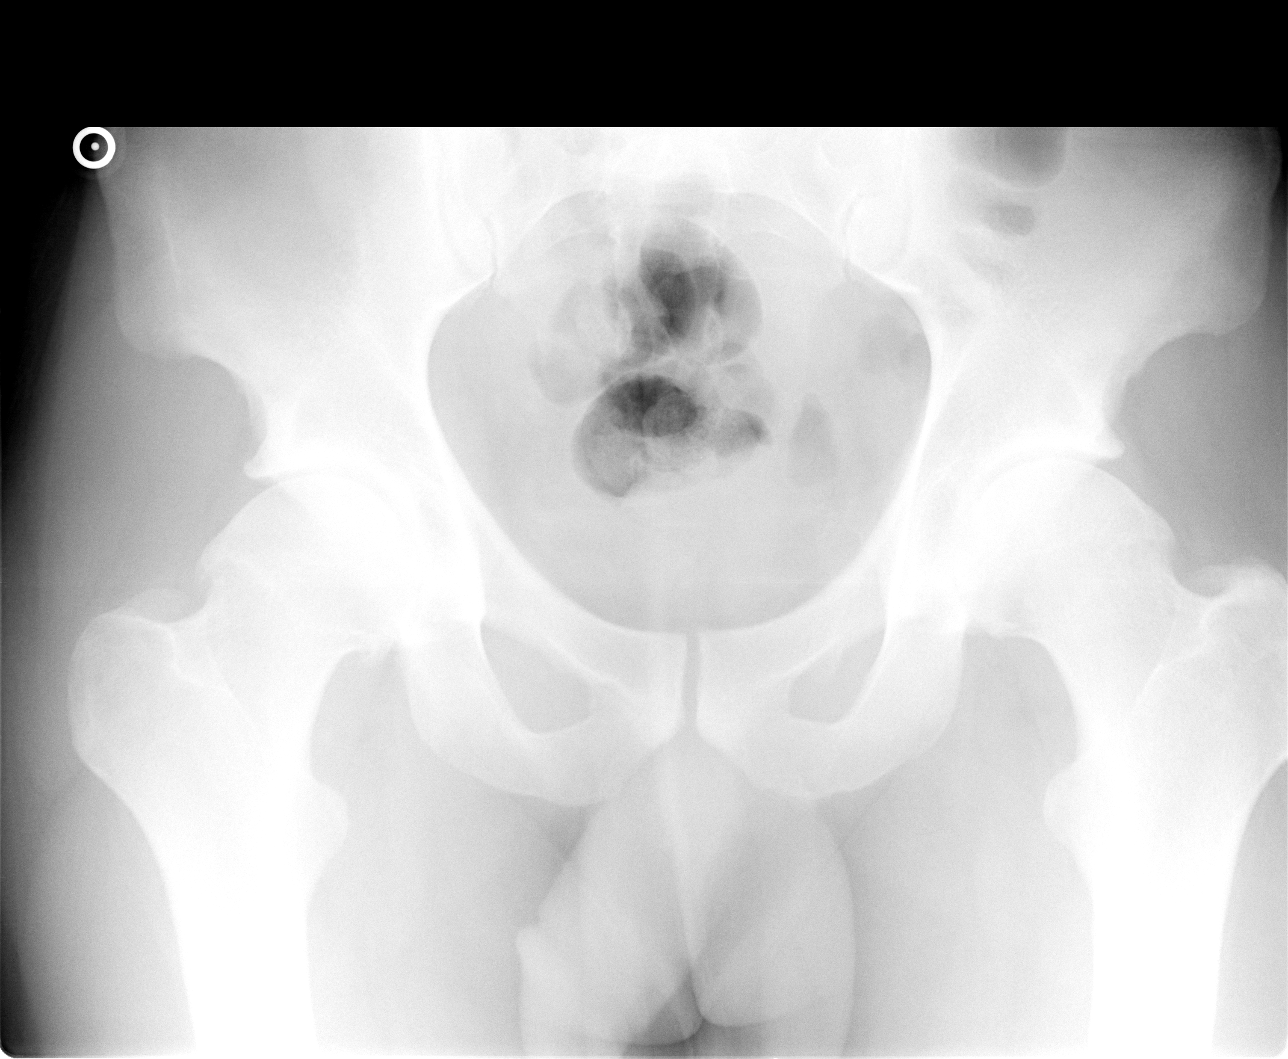

[view not recorded (3 of 4)]
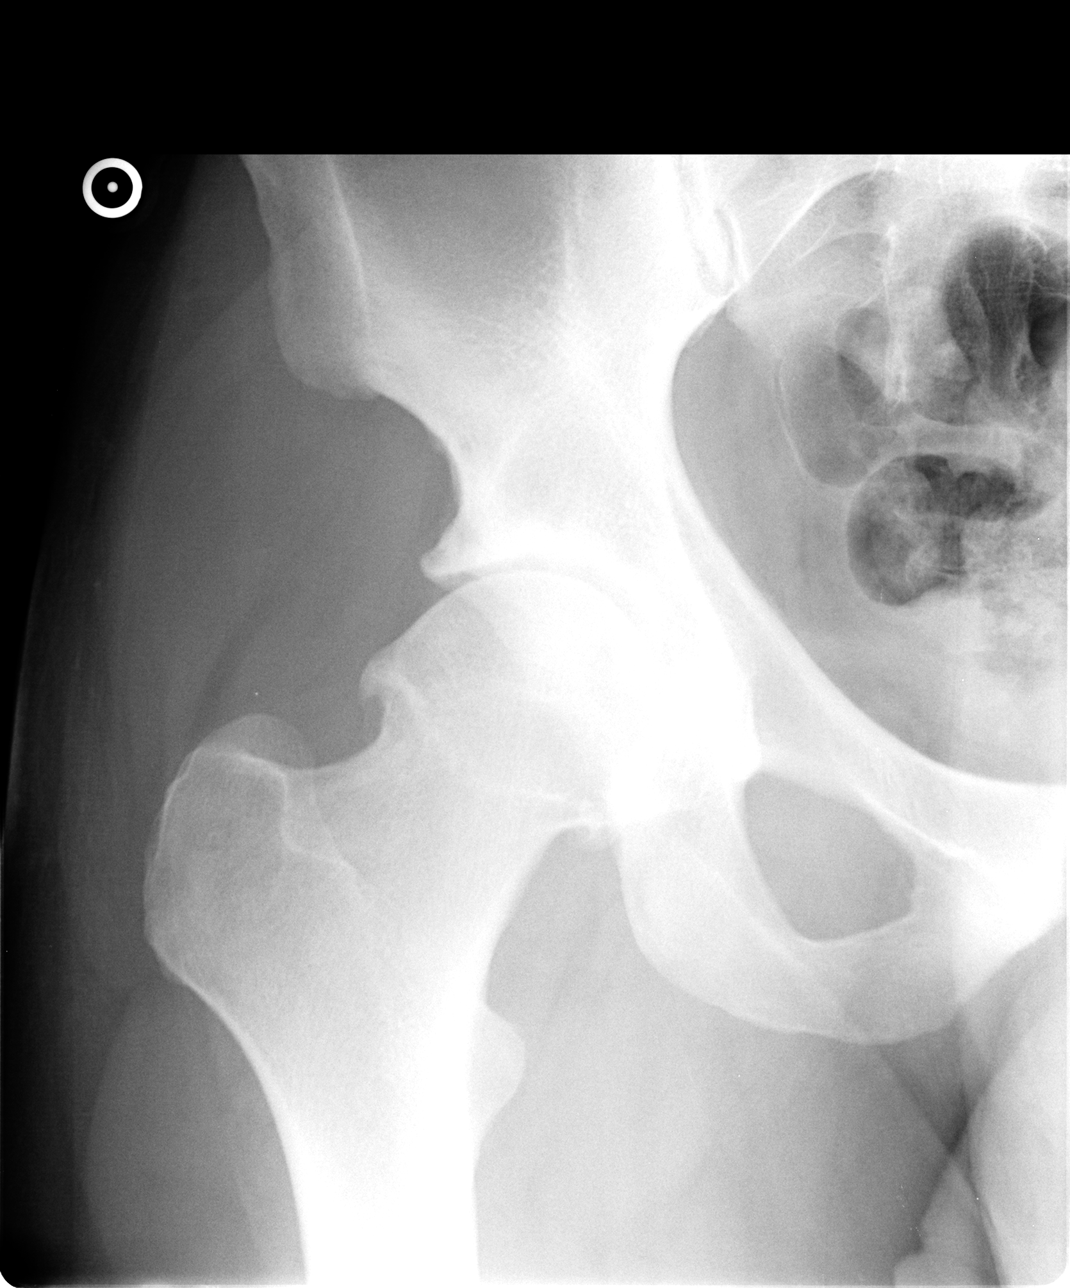

[view not recorded (4 of 4)]
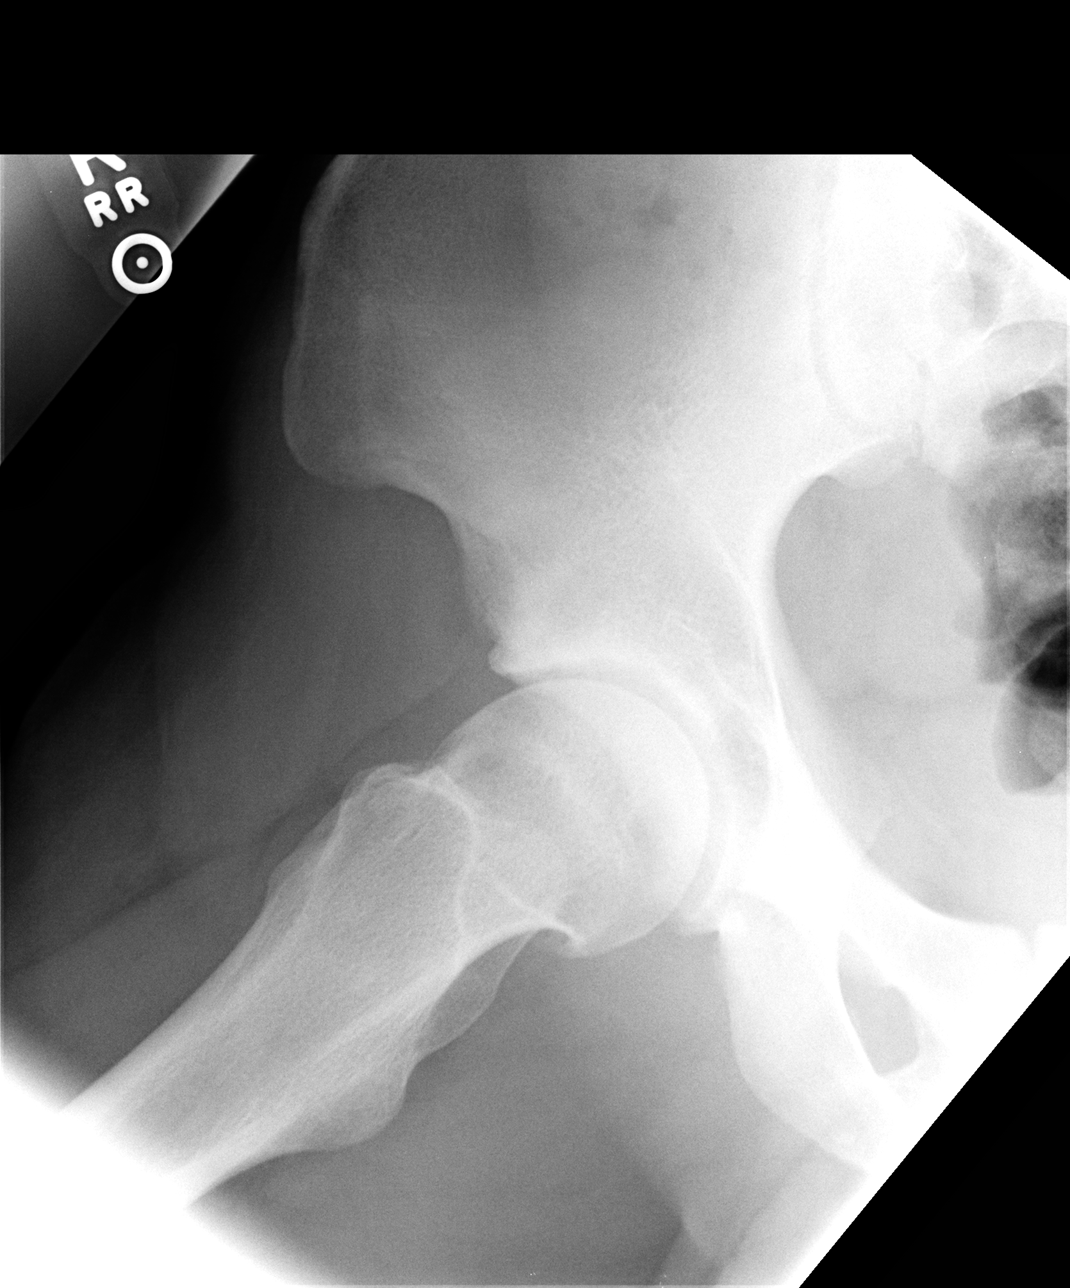

[4 of 4 positions shown; findings below may reference images not displayed]

FINDINGS: The mineralization and alignment are normal. There is no evidence of
acute fracture or dislocation. Both hips demonstrate joint space
loss and osteophyte formation, worse on the right. There is no
evidence of femoral head avascular necrosis. The sacroiliac joints
appear unremarkable.
IMPRESSION: Prominent degenerative changes of both hips, worse on the right, for
age. Consider underlying femoral acetabular impingement.

## 2015-08-26 ENCOUNTER — Ambulatory Visit (INDEPENDENT_AMBULATORY_CARE_PROVIDER_SITE_OTHER): Payer: 59

## 2015-08-26 ENCOUNTER — Encounter: Payer: Self-pay | Admitting: Family Medicine

## 2015-08-26 ENCOUNTER — Ambulatory Visit (INDEPENDENT_AMBULATORY_CARE_PROVIDER_SITE_OTHER): Payer: 59 | Admitting: Family Medicine

## 2015-08-26 VITALS — BP 136/83 | HR 94 | Ht 74.0 in | Wt 196.0 lb

## 2015-08-26 DIAGNOSIS — M25511 Pain in right shoulder: Secondary | ICD-10-CM | POA: Diagnosis not present

## 2015-08-26 MED ORDER — MELOXICAM 7.5 MG PO TABS: 7.5000 mg | ORAL_TABLET | Freq: Two times a day (BID) | ORAL | Status: DC | PRN

## 2015-08-26 NOTE — Progress Notes (Signed)
   Subjective:    Patient ID: Raymond Stewart, male    DOB: May 29, 1981, 34 y.o.   MRN: [id number]  HPI Right shoulder pain x 2 months in a right handed man  . No popping or cracking. Using naproxen and icing it. Says reaching back is painful. Says also painful to reach above 90 degrees.  No acute injurey but he is very active.  Plays golf and basketball.   Review of Systems     Objective:   Physical Exam  Constitutional: He is oriented to person, place, and time. He appears well-developed and well-nourished.  HENT:  Head: Normocephalic and atraumatic.  Musculoskeletal:  Neck with normal flexion, extension, rotation right and left and side bending. Shoulders with full abduction and abduction. He is able to reach the top of the shoulders bilaterally. Strength in shoulders is 5 out of 5 as well as the elbows and wrists. He has some weakness with liftoff test office back. Nontender over the shoulder joint itself. No abnormalities or swelling or redness.  Neurological: He is alert and oriented to person, place, and time.  Skin: Skin is warm.  Psychiatric: He has a normal mood and affect. His behavior is normal.          Assessment & Plan:  Right shoulder pain-rotator cuff injury versus bursitis. He may have a component of both. Since he has had pain for almost 2 months at this point in time and going to go ahead and move forward with an x-ray. We'll switch him to meloxicam that he is Re: Been using an anti-inflammatory and given some stretches and exercises to do on his own at home as well. We'll call results once available. If not proving of the next couple of weeks and consider subacromial bursa injection injection.

## 2015-12-30 ENCOUNTER — Ambulatory Visit (INDEPENDENT_AMBULATORY_CARE_PROVIDER_SITE_OTHER): Payer: 59 | Admitting: Family Medicine

## 2015-12-30 ENCOUNTER — Encounter: Payer: Self-pay | Admitting: Family Medicine

## 2015-12-30 VITALS — BP 113/71 | HR 77 | Wt 194.0 lb

## 2015-12-30 DIAGNOSIS — M25511 Pain in right shoulder: Secondary | ICD-10-CM
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
## 2015-12-31 ENCOUNTER — Ambulatory Visit (HOSPITAL_BASED_OUTPATIENT_CLINIC_OR_DEPARTMENT_OTHER)
Admission: RE | Admit: 2015-12-31 | Discharge: 2015-12-31 | Disposition: A | Discharge status: Home / Self Care | Payer: 59 | Source: Ambulatory Visit | Attending: Family Medicine | Admitting: Family Medicine

## 2015-12-31 DIAGNOSIS — M7551 Bursitis of right shoulder: Secondary | ICD-10-CM | POA: Insufficient documentation

## 2015-12-31 DIAGNOSIS — M25511 Pain in right shoulder: Secondary | ICD-10-CM | POA: Insufficient documentation

## 2016-01-09 ENCOUNTER — Encounter: Payer: Self-pay | Admitting: Family Medicine

## 2016-01-09 ENCOUNTER — Ambulatory Visit (INDEPENDENT_AMBULATORY_CARE_PROVIDER_SITE_OTHER): Payer: 59 | Admitting: Family Medicine

## 2016-01-09 VITALS — BP 114/74 | HR 71 | Wt 197.0 lb

## 2016-01-09 DIAGNOSIS — M7551 Bursitis of right shoulder: Secondary | ICD-10-CM | POA: Diagnosis not present

## 2016-01-09 DIAGNOSIS — M7550 Bursitis of unspecified shoulder: Secondary | ICD-10-CM | POA: Insufficient documentation

## 2016-01-09 NOTE — Patient Instructions (Signed)
Thank you for coming in today. Call or go to the ER if you develop a large red swollen joint with extreme pain or oozing puss.   Impingement Syndrome, Rotator Cuff, Bursitis With Rehab Impingement syndrome is a condition that involves inflammation of the tendons of the rotator cuff and the subacromial bursa, that causes pain in the shoulder. The rotator cuff consists of four tendons and muscles that control much of the shoulder and upper arm function. The subacromial bursa is a fluid filled sac that helps reduce friction between the rotator cuff and one of the bones of the shoulder (acromion). Impingement syndrome is usually an overuse injury that causes swelling of the bursa (bursitis), swelling of the tendon (tendonitis), and/or a tear of the tendon (strain). Strains are classified into three categories. Grade 1 strains cause pain, but the tendon is not lengthened. Grade 2 strains include a lengthened ligament, due to the ligament being stretched or partially ruptured. With grade 2 strains there is still function, although the function may be decreased. Grade 3 strains include a complete tear of the tendon or muscle, and function is usually impaired. SYMPTOMS   Pain around the shoulder, often at the outer portion of the upper arm.  Pain that gets worse with shoulder function, especially when reaching overhead or lifting.  Sometimes, aching when not using the arm.  Pain that wakes you up at night.  Sometimes, tenderness, swelling, warmth, or redness over the affected area.  Loss of strength.  Limited motion of the shoulder, especially reaching behind the back (to the back pocket or to unhook bra) or across your body.  Crackling sound (crepitation) when moving the arm.  Biceps tendon pain and inflammation (in the front of the shoulder). Worse when bending the elbow or lifting. CAUSES  Impingement syndrome is often an overuse injury, in which chronic (repetitive) motions cause the tendons or  bursa to become inflamed. A strain occurs when a force is paced on the tendon or muscle that is greater than it can withstand. Common mechanisms of injury include: Stress from sudden increase in duration, frequency, or intensity of training.  Direct hit (trauma) to the shoulder.  Aging, erosion of the tendon with normal use.  Bony bump on shoulder (acromial spur). RISK INCREASES WITH:  Contact sports (football, wrestling, boxing).  Throwing sports (baseball, tennis, volleyball).  Weightlifting and bodybuilding.  Heavy labor.  Previous injury to the rotator cuff, including impingement.  Poor shoulder strength and flexibility.  Failure to warm up properly before activity.  Inadequate protective equipment.  Old age.  Bony bump on shoulder (acromial spur). PREVENTION   Warm up and stretch properly before activity.  Allow for adequate recovery between workouts.  Maintain physical fitness:  Strength, flexibility, and endurance.  Cardiovascular fitness.  Learn and use proper exercise technique. PROGNOSIS  If treated properly, impingement syndrome usually goes away within 6 weeks. Sometimes surgery is required.  RELATED COMPLICATIONS   Longer healing time if not properly treated, or if not given enough time to heal.  Recurring symptoms, that result in a chronic condition.  Shoulder stiffness, frozen shoulder, or loss of motion.  Rotator cuff tendon tear.  Recurring symptoms, especially if activity is resumed too soon, with overuse, with a direct blow, or when using poor technique. TREATMENT  Treatment first involves the use of ice and medicine, to reduce pain and inflammation. The use of strengthening and stretching exercises may help reduce pain with activity. These exercises may be performed at home or  with a therapist. If non-surgical treatment is unsuccessful after more than 6 months, surgery may be advised. After surgery and rehabilitation, activity is usually  possible in 3 months.  MEDICATION  If pain medicine is needed, nonsteroidal anti-inflammatory medicines (aspirin and ibuprofen), or other minor pain relievers (acetaminophen), are often advised.  Do not take pain medicine for 7 days before surgery.  Prescription pain relievers may be given, if your caregiver thinks they are needed. Use only as directed and only as much as you need.  Corticosteroid injections may be given by your caregiver. These injections should be reserved for the most serious cases, because they may only be given a certain number of times. HEAT AND COLD  Cold treatment (icing) should be applied for 10 to 15 minutes every 2 to 3 hours for inflammation and pain, and immediately after activity that aggravates your symptoms. Use ice packs or an ice massage.  Heat treatment may be used before performing stretching and strengthening activities prescribed by your caregiver, physical therapist, or athletic trainer. Use a heat pack or a warm water soak. SEEK MEDICAL CARE IF:   Symptoms get worse or do not improve in 4 to 6 weeks, despite treatment.  New, unexplained symptoms develop. (Drugs used in treatment may produce side effects.) EXERCISES  RANGE OF MOTION (ROM) AND STRETCHING EXERCISES - Impingement Syndrome (Rotator Cuff  Tendinitis, Bursitis) These exercises may help you when beginning to rehabilitate your injury. Your symptoms may go away with or without further involvement from your physician, physical therapist or athletic trainer. While completing these exercises, remember:   Restoring tissue flexibility helps normal motion to return to the joints. This allows healthier, less painful movement and activity.  An effective stretch should be held for at least 30 seconds.  A stretch should never be painful. You should only feel a gentle lengthening or release in the stretched tissue. STRETCH - Flexion, Standing  Stand with good posture. With an underhand grip on your  right / left hand, and an overhand grip on the opposite hand, grasp a broomstick or cane so that your hands are a little more than shoulder width apart.  Keeping your right / left elbow straight and shoulder muscles relaxed, push the stick with your opposite hand, to raise your right / left arm in front of your body and then overhead. Raise your arm until you feel a stretch in your right / left shoulder, but before you have increased shoulder pain.  Try to avoid shrugging your right / left shoulder as your arm rises, by keeping your shoulder blade tucked down and toward your mid-back spine. Hold for __________ seconds.  Slowly return to the starting position. Repeat __________ times. Complete this exercise __________ times per day. STRETCH - Abduction, Supine  Lie on your back. With an underhand grip on your right / left hand and an overhand grip on the opposite hand, grasp a broomstick or cane so that your hands are a little more than shoulder width apart.  Keeping your right / left elbow straight and your shoulder muscles relaxed, push the stick with your opposite hand, to raise your right / left arm out to the side of your body and then overhead. Raise your arm until you feel a stretch in your right / left shoulder, but before you have increased shoulder pain.  Try to avoid shrugging your right / left shoulder as your arm rises, by keeping your shoulder blade tucked down and toward your mid-back spine. Hold   for __________ seconds.  Slowly return to the starting position. Repeat __________ times. Complete this exercise __________ times per day. ROM - Flexion, Active-Assisted  Lie on your back. You may bend your knees for comfort.  Grasp a broomstick or cane so your hands are about shoulder width apart. Your right / left hand should grip the end of the stick, so that your hand is positioned "thumbs-up," as if you were about to shake hands.  Using your healthy arm to lead, raise your right /  left arm overhead, until you feel a gentle stretch in your shoulder. Hold for __________ seconds.  Use the stick to assist in returning your right / left arm to its starting position. Repeat __________ times. Complete this exercise __________ times per day.  ROM - Internal Rotation, Supine   Lie on your back on a firm surface. Place your right / left elbow about 60 degrees away from your side. Elevate your elbow with a folded towel, so that the elbow and shoulder are the same height.  Using a broomstick or cane and your strong arm, pull your right / left hand toward your body until you feel a gentle stretch, but no increase in your shoulder pain. Keep your shoulder and elbow in place throughout the exercise.  Hold for __________ seconds. Slowly return to the starting position. Repeat __________ times. Complete this exercise __________ times per day. STRETCH - Internal Rotation  Place your right / left hand behind your back, palm up.  Throw a towel or belt over your opposite shoulder. Grasp the towel with your right / left hand.  While keeping an upright posture, gently pull up on the towel, until you feel a stretch in the front of your right / left shoulder.  Avoid shrugging your right / left shoulder as your arm rises, by keeping your shoulder blade tucked down and toward your mid-back spine.  Hold for __________ seconds. Release the stretch, by lowering your healthy hand. Repeat __________ times. Complete this exercise __________ times per day. ROM - Internal Rotation   Using an underhand grip, grasp a stick behind your back with both hands.  While standing upright with good posture, slide the stick up your back until you feel a mild stretch in the front of your shoulder.  Hold for __________ seconds. Slowly return to your starting position. Repeat __________ times. Complete this exercise __________ times per day.  STRETCH - Posterior Shoulder Capsule   Stand or sit with good  posture. Grasp your right / left elbow and draw it across your chest, keeping it at the same height as your shoulder.  Pull your elbow, so your upper arm comes in closer to your chest. Pull until you feel a gentle stretch in the back of your shoulder.  Hold for __________ seconds. Repeat __________ times. Complete this exercise __________ times per day. STRENGTHENING EXERCISES - Impingement Syndrome (Rotator Cuff Tendinitis, Bursitis) These exercises may help you when beginning to rehabilitate your injury. They may resolve your symptoms with or without further involvement from your physician, physical therapist or athletic trainer. While completing these exercises, remember:  Muscles can gain both the endurance and the strength needed for everyday activities through controlled exercises.  Complete these exercises as instructed by your physician, physical therapist or athletic trainer. Increase the resistance and repetitions only as guided.  You may experience muscle soreness or fatigue, but the pain or discomfort you are trying to eliminate should never worsen during these exercises. If this   pain does get worse, stop and make sure you are following the directions exactly. If the pain is still present after adjustments, discontinue the exercise until you can discuss the trouble with your clinician.  During your recovery, avoid activity or exercises which involve actions that place your injured hand or elbow above your head or behind your back or head. These positions stress the tissues which you are trying to heal. STRENGTH - Scapular Depression and Adduction   With good posture, sit on a firm chair. Support your arms in front of you, with pillows, arm rests, or on a table top. Have your elbows in line with the sides of your body.  Gently draw your shoulder blades down and toward your mid-back spine. Gradually increase the tension, without tensing the muscles along the top of your shoulders and  the back of your neck.  Hold for __________ seconds. Slowly release the tension and relax your muscles completely before starting the next repetition.  After you have practiced this exercise, remove the arm support and complete the exercise in standing as well as sitting position. Repeat __________ times. Complete this exercise __________ times per day.  STRENGTH - Shoulder Abductors, Isometric  With good posture, stand or sit about 4-6 inches from a wall, with your right / left side facing the wall.  Bend your right / left elbow. Gently press your right / left elbow into the wall. Increase the pressure gradually, until you are pressing as hard as you can, without shrugging your shoulder or increasing any shoulder discomfort.  Hold for __________ seconds.  Release the tension slowly. Relax your shoulder muscles completely before you begin the next repetition. Repeat __________ times. Complete this exercise __________ times per day.  STRENGTH - External Rotators, Isometric  Keep your right / left elbow at your side and bend it 90 degrees.  Step into a door frame so that the outside of your right / left wrist can press against the door frame without your upper arm leaving your side.  Gently press your right / left wrist into the door frame, as if you were trying to swing the back of your hand away from your stomach. Gradually increase the tension, until you are pressing as hard as you can, without shrugging your shoulder or increasing any shoulder discomfort.  Hold for __________ seconds.  Release the tension slowly. Relax your shoulder muscles completely before you begin the next repetition. Repeat __________ times. Complete this exercise __________ times per day.  STRENGTH - Supraspinatus   Stand or sit with good posture. Grasp a __________ weight, or an exercise band or tubing, so that your hand is "thumbs-up," like you are shaking hands.  Slowly lift your right / left arm in a "V"  away from your thigh, diagonally into the space between your side and straight ahead. Lift your hand to shoulder height or as far as you can, without increasing any shoulder pain. At first, many people do not lift their hands above shoulder height.  Avoid shrugging your right / left shoulder as your arm rises, by keeping your shoulder blade tucked down and toward your mid-back spine.  Hold for __________ seconds. Control the descent of your hand, as you slowly return to your starting position. Repeat __________ times. Complete this exercise __________ times per day.  STRENGTH - External Rotators  Secure a rubber exercise band or tubing to a fixed object (table, pole) so that it is at the same height as your right /   left elbow when you are standing or sitting on a firm surface.  Stand or sit so that the secured exercise band is at your uninjured side.  Bend your right / left elbow 90 degrees. Place a folded towel or small pillow under your right / left arm, so that your elbow is a few inches away from your side.  Keeping the tension on the exercise band, pull it away from your body, as if pivoting on your elbow. Be sure to keep your body steady, so that the movement is coming only from your rotating shoulder.  Hold for __________ seconds. Release the tension in a controlled manner, as you return to the starting position. Repeat __________ times. Complete this exercise __________ times per day.  STRENGTH - Internal Rotators   Secure a rubber exercise band or tubing to a fixed object (table, pole) so that it is at the same height as your right / left elbow when you are standing or sitting on a firm surface.  Stand or sit so that the secured exercise band is at your right / left side.  Bend your elbow 90 degrees. Place a folded towel or small pillow under your right / left arm so that your elbow is a few inches away from your side.  Keeping the tension on the exercise band, pull it across your  body, toward your stomach. Be sure to keep your body steady, so that the movement is coming only from your rotating shoulder.  Hold for __________ seconds. Release the tension in a controlled manner, as you return to the starting position. Repeat __________ times. Complete this exercise __________ times per day.  STRENGTH - Scapular Protractors, Standing   Stand arms length away from a wall. Place your hands on the wall, keeping your elbows straight.  Begin by dropping your shoulder blades down and toward your mid-back spine.  To strengthen your protractors, keep your shoulder blades down, but slide them forward on your rib cage. It will feel as if you are lifting the back of your rib cage away from the wall. This is a subtle motion and can be challenging to complete. Ask your caregiver for further instruction, if you are not sure you are doing the exercise correctly.  Hold for __________ seconds. Slowly return to the starting position, resting the muscles completely before starting the next repetition. Repeat __________ times. Complete this exercise __________ times per day. STRENGTH - Scapular Protractors, Supine  Lie on your back on a firm surface. Extend your right / left arm straight into the air while holding a __________ weight in your hand.  Keeping your head and back in place, lift your shoulder off the floor.  Hold for __________ seconds. Slowly return to the starting position, and allow your muscles to relax completely before starting the next repetition. Repeat __________ times. Complete this exercise __________ times per day. STRENGTH - Scapular Protractors, Quadruped  Get onto your hands and knees, with your shoulders directly over your hands (or as close as you can be, comfortably).  Keeping your elbows locked, lift the back of your rib cage up into your shoulder blades, so your mid-back rounds out. Keep your neck muscles relaxed.  Hold this position for __________ seconds.  Slowly return to the starting position and allow your muscles to relax completely before starting the next repetition. Repeat __________ times. Complete this exercise __________ times per day.  STRENGTH - Scapular Retractors  Secure a rubber exercise band or tubing to a   fixed object (table, pole), so that it is at the height of your shoulders when you are either standing, or sitting on a firm armless chair.  With a palm down grip, grasp an end of the band in each hand. Straighten your elbows and lift your hands straight in front of you, at shoulder height. Step back, away from the secured end of the band, until it becomes tense.  Squeezing your shoulder blades together, draw your elbows back toward your sides, as you bend them. Keep your upper arms lifted away from your body throughout the exercise.  Hold for __________ seconds. Slowly ease the tension on the band, as you reverse the directions and return to the starting position. Repeat __________ times. Complete this exercise __________ times per day. STRENGTH - Shoulder Extensors   Secure a rubber exercise band or tubing to a fixed object (table, pole) so that it is at the height of your shoulders when you are either standing, or sitting on a firm armless chair.  With a thumbs-up grip, grasp an end of the band in each hand. Straighten your elbows and lift your hands straight in front of you, at shoulder height. Step back, away from the secured end of the band, until it becomes tense.  Squeezing your shoulder blades together, pull your hands down to the sides of your thighs. Do not allow your hands to go behind you.  Hold for __________ seconds. Slowly ease the tension on the band, as you reverse the directions and return to the starting position. Repeat __________ times. Complete this exercise __________ times per day.  STRENGTH - Scapular Retractors and External Rotators   Secure a rubber exercise band or tubing to a fixed object (table,  pole) so that it is at the height as your shoulders, when you are either standing, or sitting on a firm armless chair.  With a palm down grip, grasp an end of the band in each hand. Bend your elbows 90 degrees and lift your elbows to shoulder height, at your sides. Step back, away from the secured end of the band, until it becomes tense.  Squeezing your shoulder blades together, rotate your shoulders so that your upper arms and elbows remain stationary, but your fists travel upward to head height.  Hold for __________ seconds. Slowly ease the tension on the band, as you reverse the directions and return to the starting position. Repeat __________ times. Complete this exercise __________ times per day.  STRENGTH - Scapular Retractors and External Rotators, Rowing   Secure a rubber exercise band or tubing to a fixed object (table, pole) so that it is at the height of your shoulders, when you are either standing, or sitting on a firm armless chair.  With a palm down grip, grasp an end of the band in each hand. Straighten your elbows and lift your hands straight in front of you, at shoulder height. Step back, away from the secured end of the band, until it becomes tense.  Step 1: Squeeze your shoulder blades together. Bending your elbows, draw your hands to your chest, as if you are rowing a boat. At the end of this motion, your hands and elbow should be at shoulder height and your elbows should be out to your sides.  Step 2: Rotate your shoulders, to raise your hands above your head. Your forearms should be vertical and your upper arms should be horizontal.  Hold for __________ seconds. Slowly ease the tension on the band, as you  reverse the directions and return to the starting position. Repeat __________ times. Complete this exercise __________ times per day.  STRENGTH - Scapular Depressors  Find a sturdy chair without wheels, such as a dining room chair.  Keeping your feet on the floor, and  your hands on the chair arms, lift your bottom up from the seat, and lock your elbows.  Keeping your elbows straight, allow gravity to pull your body weight down. Your shoulders will rise toward your ears.  Raise your body against gravity by drawing your shoulder blades down your back, shortening the distance between your shoulders and ears. Although your feet should always maintain contact with the floor, your feet should progressively support less body weight, as you get stronger.  Hold for __________ seconds. In a controlled and slow manner, lower your body weight to begin the next repetition. Repeat __________ times. Complete this exercise __________ times per day.    This information is not intended to replace advice given to you by your health care provider. Make sure you discuss any questions you have with your health care provider.   Document Released: 12/03/2005 Document Revised: 12/24/2014 Document Reviewed: 03/17/2009 [employer] Interactive Patient Education [employer]

## 2016-01-09 NOTE — Assessment & Plan Note (Signed)
Status post injection. Discussed home exercise program. Return in one month if not better.
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
## 2016-01-12 ENCOUNTER — Encounter: Payer: Self-pay | Admitting: Family Medicine

## 2016-01-13 ENCOUNTER — Encounter: Payer: Self-pay | Admitting: Family Medicine

## 2016-01-13 ENCOUNTER — Ambulatory Visit (INDEPENDENT_AMBULATORY_CARE_PROVIDER_SITE_OTHER): Payer: 59 | Admitting: Family Medicine

## 2016-01-13 VITALS — BP 131/70 | HR 65 | Wt 193.0 lb

## 2016-01-13 DIAGNOSIS — M7551 Bursitis of right shoulder: Secondary | ICD-10-CM

## 2016-01-13 MED ORDER — TRAMADOL HCL 50 MG PO TABS
50.0000 mg | ORAL_TABLET | Freq: Three times a day (TID) | ORAL | Status: DC | PRN
Start: 2016-01-13 — End: 2016-06-11

## 2016-01-13 NOTE — Assessment & Plan Note (Signed)
Much better with injection today. I'm not quite sure why he did not perceive benefit from the injection earlier this week. Plan for home exercise program. Recheck in one month or so. Tramadol prescribed for use as needed.

## 2016-01-13 NOTE — Progress Notes (Signed)
       Raymond Stewart is a 35 y.o. male who presents to [facility]: Primary Care today for follow-up right shoulder pain. Patient was seen earlier this week for right shoulder pain. MRI showed subacromial bursitis. He had a ultrasound guided injection that did not help initially and has not helped at all since. He has pain is consistent. Pain is not worsened. No fevers chills nausea vomiting diarrhea. No skin changes or pus discharge from wound.   Past Medical History  Diagnosis Date  . Smoker 09/04/2012  . ANKLE PAIN, RIGHT 09/04/2010    Qualifier: Diagnosis of  By: Orson Aloe MD, Tinnie Gens     Past Surgical History  Procedure Laterality Date  . Right knee bone spurs  6-00  . Left hand  8-08   Social History  Substance Use Topics  . Smoking status: Current Every Day Smoker -- 0.50 packs/day for 12 years  . Smokeless tobacco: Not on file  . Alcohol Use: No   family history includes Cancer in his other; Diabetes in his other; Heart attack in his father; Heart disease in his father; Hypertension in his father.  ROS as above Medications: Current Outpatient Prescriptions  Medication Sig Dispense Refill  . propranolol (INDERAL) 10 MG tablet Take 2 tablets (20 mg total) by mouth 2 (two) times daily. To help prevent migraine. 60 tablet 4  . traMADol (ULTRAM) 50 MG tablet Take 1 tablet (50 mg total) by mouth every 8 (eight) hours as needed. 14 tablet 0   No current facility-administered medications for this visit.   No Known Allergies   Exam:  BP 131/70 mmHg  Pulse 65  Wt 193 lb (87.544 kg) Gen: Well NAD Right shoulder is normal appearing. Skin is not erythematous. Normal shoulder motion but pain with abduction. Positive impingement testing. Pulses capillary refill and sensation intact distally.  Procedure: Real-time Ultrasound Guided Injection of right subacromial bursa  Device: GE Logiq E   Images permanently stored and available for review in the ultrasound unit. Verbal informed consent obtained. Discussed risks and benefits of procedure. Warned about infection bleeding damage to structures skin hypopigmentation and fat atrophy among others. Patient expresses understanding and agreement Time-out conducted.  Noted no overlying erythema, induration, or other signs of local infection.  Skin prepped in a sterile fashion.  Local anesthesia: Topical Ethyl chloride.  With sterile technique and under real time ultrasound guidance: 4 L of Marcaine and 40 mg of Kenalog injected easily.  Completed without difficulty  Pain immediately resolved suggesting accurate placement of the medication.  Patient had dramatic improvement in pain. Advised to call if fevers/chills, erythema, induration, drainage, or persistent bleeding.  Images permanently stored and available for review in the ultrasound unit.  Impression: Technically successful ultrasound guided injection.   No results found for this or any previous visit (from the past 24 hour(s)). No results found.   Please see individual assessment and plan sections.

## 2016-01-13 NOTE — Patient Instructions (Addendum)
Thank you for coming in today. Return if not better.  Call or go to the ER if you develop a large red swollen joint with extreme pain or oozing puss.  Use tramadol for severe pain.   Impingement Syndrome, Rotator Cuff, Bursitis With Rehab Impingement syndrome is a condition that involves inflammation of the tendons of the rotator cuff and the subacromial bursa, that causes pain in the shoulder. The rotator cuff consists of four tendons and muscles that control much of the shoulder and upper arm function. The subacromial bursa is a fluid filled sac that helps reduce friction between the rotator cuff and one of the bones of the shoulder (acromion). Impingement syndrome is usually an overuse injury that causes swelling of the bursa (bursitis), swelling of the tendon (tendonitis), and/or a tear of the tendon (strain). Strains are classified into three categories. Grade 1 strains cause pain, but the tendon is not lengthened. Grade 2 strains include a lengthened ligament, due to the ligament being stretched or partially ruptured. With grade 2 strains there is still function, although the function may be decreased. Grade 3 strains include a complete tear of the tendon or muscle, and function is usually impaired. SYMPTOMS   Pain around the shoulder, often at the outer portion of the upper arm.  Pain that gets worse with shoulder function, especially when reaching overhead or lifting.  Sometimes, aching when not using the arm.  Pain that wakes you up at night.  Sometimes, tenderness, swelling, warmth, or redness over the affected area.  Loss of strength.  Limited motion of the shoulder, especially reaching behind the back (to the back pocket or to unhook bra) or across your body.  Crackling sound (crepitation) when moving the arm.  Biceps tendon pain and inflammation (in the front of the shoulder). Worse when bending the elbow or lifting. CAUSES  Impingement syndrome is often an overuse injury, in  which chronic (repetitive) motions cause the tendons or bursa to become inflamed. A strain occurs when a force is paced on the tendon or muscle that is greater than it can withstand. Common mechanisms of injury include: Stress from sudden increase in duration, frequency, or intensity of training.  Direct hit (trauma) to the shoulder.  Aging, erosion of the tendon with normal use.  Bony bump on shoulder (acromial spur). RISK INCREASES WITH:  Contact sports (football, wrestling, boxing).  Throwing sports (baseball, tennis, volleyball).  Weightlifting and bodybuilding.  Heavy labor.  Previous injury to the rotator cuff, including impingement.  Poor shoulder strength and flexibility.  Failure to warm up properly before activity.  Inadequate protective equipment.  Old age.  Bony bump on shoulder (acromial spur). PREVENTION   Warm up and stretch properly before activity.  Allow for adequate recovery between workouts.  Maintain physical fitness:  Strength, flexibility, and endurance.  Cardiovascular fitness.  Learn and use proper exercise technique. PROGNOSIS  If treated properly, impingement syndrome usually goes away within 6 weeks. Sometimes surgery is required.  RELATED COMPLICATIONS   Longer healing time if not properly treated, or if not given enough time to heal.  Recurring symptoms, that result in a chronic condition.  Shoulder stiffness, frozen shoulder, or loss of motion.  Rotator cuff tendon tear.  Recurring symptoms, especially if activity is resumed too soon, with overuse, with a direct blow, or when using poor technique. TREATMENT  Treatment first involves the use of ice and medicine, to reduce pain and inflammation. The use of strengthening and stretching exercises may help reduce  pain with activity. These exercises may be performed at home or with a therapist. If non-surgical treatment is unsuccessful after more than 6 months, surgery may be advised.  After surgery and rehabilitation, activity is usually possible in 3 months.  MEDICATION  If pain medicine is needed, nonsteroidal anti-inflammatory medicines (aspirin and ibuprofen), or other minor pain relievers (acetaminophen), are often advised.  Do not take pain medicine for 7 days before surgery.  Prescription pain relievers may be given, if your caregiver thinks they are needed. Use only as directed and only as much as you need.  Corticosteroid injections may be given by your caregiver. These injections should be reserved for the most serious cases, because they may only be given a certain number of times. HEAT AND COLD  Cold treatment (icing) should be applied for 10 to 15 minutes every 2 to 3 hours for inflammation and pain, and immediately after activity that aggravates your symptoms. Use ice packs or an ice massage.  Heat treatment may be used before performing stretching and strengthening activities prescribed by your caregiver, physical therapist, or athletic trainer. Use a heat pack or a warm water soak. SEEK MEDICAL CARE IF:   Symptoms get worse or do not improve in 4 to 6 weeks, despite treatment.  New, unexplained symptoms develop. (Drugs used in treatment may produce side effects.) EXERCISES  RANGE OF MOTION (ROM) AND STRETCHING EXERCISES - Impingement Syndrome (Rotator Cuff  Tendinitis, Bursitis) These exercises may help you when beginning to rehabilitate your injury. Your symptoms may go away with or without further involvement from your physician, physical therapist or athletic trainer. While completing these exercises, remember:   Restoring tissue flexibility helps normal motion to return to the joints. This allows healthier, less painful movement and activity.  An effective stretch should be held for at least 30 seconds.  A stretch should never be painful. You should only feel a gentle lengthening or release in the stretched tissue. STRETCH - Flexion,  Standing  Stand with good posture. With an underhand grip on your right / left hand, and an overhand grip on the opposite hand, grasp a broomstick or cane so that your hands are a little more than shoulder width apart.  Keeping your right / left elbow straight and shoulder muscles relaxed, push the stick with your opposite hand, to raise your right / left arm in front of your body and then overhead. Raise your arm until you feel a stretch in your right / left shoulder, but before you have increased shoulder pain.  Try to avoid shrugging your right / left shoulder as your arm rises, by keeping your shoulder blade tucked down and toward your mid-back spine. Hold for __________ seconds.  Slowly return to the starting position. Repeat __________ times. Complete this exercise __________ times per day. STRETCH - Abduction, Supine  Lie on your back. With an underhand grip on your right / left hand and an overhand grip on the opposite hand, grasp a broomstick or cane so that your hands are a little more than shoulder width apart.  Keeping your right / left elbow straight and your shoulder muscles relaxed, push the stick with your opposite hand, to raise your right / left arm out to the side of your body and then overhead. Raise your arm until you feel a stretch in your right / left shoulder, but before you have increased shoulder pain.  Try to avoid shrugging your right / left shoulder as your arm rises, by keeping  your shoulder blade tucked down and toward your mid-back spine. Hold for __________ seconds.  Slowly return to the starting position. Repeat __________ times. Complete this exercise __________ times per day. ROM - Flexion, Active-Assisted  Lie on your back. You may bend your knees for comfort.  Grasp a broomstick or cane so your hands are about shoulder width apart. Your right / left hand should grip the end of the stick, so that your hand is positioned "thumbs-up," as if you were about to  shake hands.  Using your healthy arm to lead, raise your right / left arm overhead, until you feel a gentle stretch in your shoulder. Hold for __________ seconds.  Use the stick to assist in returning your right / left arm to its starting position. Repeat __________ times. Complete this exercise __________ times per day.  ROM - Internal Rotation, Supine   Lie on your back on a firm surface. Place your right / left elbow about 60 degrees away from your side. Elevate your elbow with a folded towel, so that the elbow and shoulder are the same height.  Using a broomstick or cane and your strong arm, pull your right / left hand toward your body until you feel a gentle stretch, but no increase in your shoulder pain. Keep your shoulder and elbow in place throughout the exercise.  Hold for __________ seconds. Slowly return to the starting position. Repeat __________ times. Complete this exercise __________ times per day. STRETCH - Internal Rotation  Place your right / left hand behind your back, palm up.  Throw a towel or belt over your opposite shoulder. Grasp the towel with your right / left hand.  While keeping an upright posture, gently pull up on the towel, until you feel a stretch in the front of your right / left shoulder.  Avoid shrugging your right / left shoulder as your arm rises, by keeping your shoulder blade tucked down and toward your mid-back spine.  Hold for __________ seconds. Release the stretch, by lowering your healthy hand. Repeat __________ times. Complete this exercise __________ times per day. ROM - Internal Rotation   Using an underhand grip, grasp a stick behind your back with both hands.  While standing upright with good posture, slide the stick up your back until you feel a mild stretch in the front of your shoulder.  Hold for __________ seconds. Slowly return to your starting position. Repeat __________ times. Complete this exercise __________ times per day.   STRETCH - Posterior Shoulder Capsule   Stand or sit with good posture. Grasp your right / left elbow and draw it across your chest, keeping it at the same height as your shoulder.  Pull your elbow, so your upper arm comes in closer to your chest. Pull until you feel a gentle stretch in the back of your shoulder.  Hold for __________ seconds. Repeat __________ times. Complete this exercise __________ times per day. STRENGTHENING EXERCISES - Impingement Syndrome (Rotator Cuff Tendinitis, Bursitis) These exercises may help you when beginning to rehabilitate your injury. They may resolve your symptoms with or without further involvement from your physician, physical therapist or athletic trainer. While completing these exercises, remember:  Muscles can gain both the endurance and the strength needed for everyday activities through controlled exercises.  Complete these exercises as instructed by your physician, physical therapist or athletic trainer. Increase the resistance and repetitions only as guided.  You may experience muscle soreness or fatigue, but the pain or discomfort you are  trying to eliminate should never worsen during these exercises. If this pain does get worse, stop and make sure you are following the directions exactly. If the pain is still present after adjustments, discontinue the exercise until you can discuss the trouble with your clinician.  During your recovery, avoid activity or exercises which involve actions that place your injured hand or elbow above your head or behind your back or head. These positions stress the tissues which you are trying to heal. STRENGTH - Scapular Depression and Adduction   With good posture, sit on a firm chair. Support your arms in front of you, with pillows, arm rests, or on a table top. Have your elbows in line with the sides of your body.  Gently draw your shoulder blades down and toward your mid-back spine. Gradually increase the tension,  without tensing the muscles along the top of your shoulders and the back of your neck.  Hold for __________ seconds. Slowly release the tension and relax your muscles completely before starting the next repetition.  After you have practiced this exercise, remove the arm support and complete the exercise in standing as well as sitting position. Repeat __________ times. Complete this exercise __________ times per day.  STRENGTH - Shoulder Abductors, Isometric  With good posture, stand or sit about 4-6 inches from a wall, with your right / left side facing the wall.  Bend your right / left elbow. Gently press your right / left elbow into the wall. Increase the pressure gradually, until you are pressing as hard as you can, without shrugging your shoulder or increasing any shoulder discomfort.  Hold for __________ seconds.  Release the tension slowly. Relax your shoulder muscles completely before you begin the next repetition. Repeat __________ times. Complete this exercise __________ times per day.  STRENGTH - External Rotators, Isometric  Keep your right / left elbow at your side and bend it 90 degrees.  Step into a door frame so that the outside of your right / left wrist can press against the door frame without your upper arm leaving your side.  Gently press your right / left wrist into the door frame, as if you were trying to swing the back of your hand away from your stomach. Gradually increase the tension, until you are pressing as hard as you can, without shrugging your shoulder or increasing any shoulder discomfort.  Hold for __________ seconds.  Release the tension slowly. Relax your shoulder muscles completely before you begin the next repetition. Repeat __________ times. Complete this exercise __________ times per day.  STRENGTH - Supraspinatus   Stand or sit with good posture. Grasp a __________ weight, or an exercise band or tubing, so that your hand is "thumbs-up," like you  are shaking hands.  Slowly lift your right / left arm in a "V" away from your thigh, diagonally into the space between your side and straight ahead. Lift your hand to shoulder height or as far as you can, without increasing any shoulder pain. At first, many people do not lift their hands above shoulder height.  Avoid shrugging your right / left shoulder as your arm rises, by keeping your shoulder blade tucked down and toward your mid-back spine.  Hold for __________ seconds. Control the descent of your hand, as you slowly return to your starting position. Repeat __________ times. Complete this exercise __________ times per day.  STRENGTH - External Rotators  Secure a rubber exercise band or tubing to a fixed object (table, pole) so  that it is at the same height as your right / left elbow when you are standing or sitting on a firm surface.  Stand or sit so that the secured exercise band is at your uninjured side.  Bend your right / left elbow 90 degrees. Place a folded towel or small pillow under your right / left arm, so that your elbow is a few inches away from your side.  Keeping the tension on the exercise band, pull it away from your body, as if pivoting on your elbow. Be sure to keep your body steady, so that the movement is coming only from your rotating shoulder.  Hold for __________ seconds. Release the tension in a controlled manner, as you return to the starting position. Repeat __________ times. Complete this exercise __________ times per day.  STRENGTH - Internal Rotators   Secure a rubber exercise band or tubing to a fixed object (table, pole) so that it is at the same height as your right / left elbow when you are standing or sitting on a firm surface.  Stand or sit so that the secured exercise band is at your right / left side.  Bend your elbow 90 degrees. Place a folded towel or small pillow under your right / left arm so that your elbow is a few inches away from your  side.  Keeping the tension on the exercise band, pull it across your body, toward your stomach. Be sure to keep your body steady, so that the movement is coming only from your rotating shoulder.  Hold for __________ seconds. Release the tension in a controlled manner, as you return to the starting position. Repeat __________ times. Complete this exercise __________ times per day.  STRENGTH - Scapular Protractors, Standing   Stand arms length away from a wall. Place your hands on the wall, keeping your elbows straight.  Begin by dropping your shoulder blades down and toward your mid-back spine.  To strengthen your protractors, keep your shoulder blades down, but slide them forward on your rib cage. It will feel as if you are lifting the back of your rib cage away from the wall. This is a subtle motion and can be challenging to complete. Ask your caregiver for further instruction, if you are not sure you are doing the exercise correctly.  Hold for __________ seconds. Slowly return to the starting position, resting the muscles completely before starting the next repetition. Repeat __________ times. Complete this exercise __________ times per day. STRENGTH - Scapular Protractors, Supine  Lie on your back on a firm surface. Extend your right / left arm straight into the air while holding a __________ weight in your hand.  Keeping your head and back in place, lift your shoulder off the floor.  Hold for __________ seconds. Slowly return to the starting position, and allow your muscles to relax completely before starting the next repetition. Repeat __________ times. Complete this exercise __________ times per day. STRENGTH - Scapular Protractors, Quadruped  Get onto your hands and knees, with your shoulders directly over your hands (or as close as you can be, comfortably).  Keeping your elbows locked, lift the back of your rib cage up into your shoulder blades, so your mid-back rounds out. Keep  your neck muscles relaxed.  Hold this position for __________ seconds. Slowly return to the starting position and allow your muscles to relax completely before starting the next repetition. Repeat __________ times. Complete this exercise __________ times per day.  STRENGTH - Scapular  Retractors  Secure a rubber exercise band or tubing to a fixed object (table, pole), so that it is at the height of your shoulders when you are either standing, or sitting on a firm armless chair.  With a palm down grip, grasp an end of the band in each hand. Straighten your elbows and lift your hands straight in front of you, at shoulder height. Step back, away from the secured end of the band, until it becomes tense.  Squeezing your shoulder blades together, draw your elbows back toward your sides, as you bend them. Keep your upper arms lifted away from your body throughout the exercise.  Hold for __________ seconds. Slowly ease the tension on the band, as you reverse the directions and return to the starting position. Repeat __________ times. Complete this exercise __________ times per day. STRENGTH - Shoulder Extensors   Secure a rubber exercise band or tubing to a fixed object (table, pole) so that it is at the height of your shoulders when you are either standing, or sitting on a firm armless chair.  With a thumbs-up grip, grasp an end of the band in each hand. Straighten your elbows and lift your hands straight in front of you, at shoulder height. Step back, away from the secured end of the band, until it becomes tense.  Squeezing your shoulder blades together, pull your hands down to the sides of your thighs. Do not allow your hands to go behind you.  Hold for __________ seconds. Slowly ease the tension on the band, as you reverse the directions and return to the starting position. Repeat __________ times. Complete this exercise __________ times per day.  STRENGTH - Scapular Retractors and External  Rotators   Secure a rubber exercise band or tubing to a fixed object (table, pole) so that it is at the height as your shoulders, when you are either standing, or sitting on a firm armless chair.  With a palm down grip, grasp an end of the band in each hand. Bend your elbows 90 degrees and lift your elbows to shoulder height, at your sides. Step back, away from the secured end of the band, until it becomes tense.  Squeezing your shoulder blades together, rotate your shoulders so that your upper arms and elbows remain stationary, but your fists travel upward to head height.  Hold for __________ seconds. Slowly ease the tension on the band, as you reverse the directions and return to the starting position. Repeat __________ times. Complete this exercise __________ times per day.  STRENGTH - Scapular Retractors and External Rotators, Rowing   Secure a rubber exercise band or tubing to a fixed object (table, pole) so that it is at the height of your shoulders, when you are either standing, or sitting on a firm armless chair.  With a palm down grip, grasp an end of the band in each hand. Straighten your elbows and lift your hands straight in front of you, at shoulder height. Step back, away from the secured end of the band, until it becomes tense.  Step 1: Squeeze your shoulder blades together. Bending your elbows, draw your hands to your chest, as if you are rowing a boat. At the end of this motion, your hands and elbow should be at shoulder height and your elbows should be out to your sides.  Step 2: Rotate your shoulders, to raise your hands above your head. Your forearms should be vertical and your upper arms should be horizontal.  Hold for  __________ seconds. Slowly ease the tension on the band, as you reverse the directions and return to the starting position. Repeat __________ times. Complete this exercise __________ times per day.  STRENGTH - Scapular Depressors  Find a sturdy chair  without wheels, such as a dining room chair.  Keeping your feet on the floor, and your hands on the chair arms, lift your bottom up from the seat, and lock your elbows.  Keeping your elbows straight, allow gravity to pull your body weight down. Your shoulders will rise toward your ears.  Raise your body against gravity by drawing your shoulder blades down your back, shortening the distance between your shoulders and ears. Although your feet should always maintain contact with the floor, your feet should progressively support less body weight, as you get stronger.  Hold for __________ seconds. In a controlled and slow manner, lower your body weight to begin the next repetition. Repeat __________ times. Complete this exercise __________ times per day.    This information is not intended to replace advice given to you by your health care provider. Make sure you discuss any questions you have with your health care provider.   Document Released: 12/03/2005 Document Revised: 12/24/2014 Document Reviewed: 03/17/2009 [employer] Interactive Patient Education [employer]

## 2016-02-22 ENCOUNTER — Encounter: Payer: Self-pay | Admitting: Family Medicine

## 2016-02-22 ENCOUNTER — Encounter: Payer: Self-pay | Admitting: Sports Medicine

## 2016-02-23 MED ORDER — MELOXICAM 15 MG PO TABS: ORAL_TABLET | ORAL | Status: DC

## 2016-05-01 ENCOUNTER — Encounter: Payer: Self-pay | Admitting: Family Medicine

## 2016-05-02 ENCOUNTER — Encounter: Payer: Self-pay | Admitting: Family Medicine

## 2016-05-20 ENCOUNTER — Telehealth: Payer: Self-pay | Admitting: Family Medicine

## 2016-05-20 NOTE — Telephone Encounter (Signed)
This message is to inform you that the patient has not yet read the following message. (Notification date: May 18, 2016)    RE: Non-Urgent Medical Question    From  Raymond Gamesatherine D Iretta Mangrum, MD   To  Raymond Stewart     Sent  05/04/2016 8:38 AM     Good Morning Raymond Stewart,  Let's do it! I have one of the ladies up from call you and see what works with your schedule    Dr. Judie PetitM      Previous Messages     ----- Message -----   From: [name],Raymond Stewart   Sent: 05/03/2016 12:57 PM EDT    To: Raymond GasserMETHENEY,[name], MD  Subject: RE: Non-Urgent Medical Question   Dr. Linford ArnoldMetheney,  I have been working this schedule since I started in 2007. It has recently started causing me issues because our hours constantly change. Especially right now with a project we are bringing in to make zzzquil. So as far as the control of my shift hours, I dont. I sleep actually really well. I get about 8 hours of sleep every night. No less than 7. If we could setup a visit anytime would be great!!   Raymond Stewart  ----- Message -----  From: Raymond GasserMETHENEY,Azile Minardi, MD  Sent: 05/03/2016 12:29 PM EDT  To: Raymond Stewart  Subject: RE: Non-Urgent Medical Question   Hi Raymond Stewart,   I definitely think you shift schedule is completely upsetting your natural sleep cycles. You brain doesn't know when to release natural melatonin to tell you when to sleep and when not to sleep. Is this schedule short term or do you have control over this? Unfortunately there is not quick fix for this but to adjust the schedule some and make sure you are getting adequate sleep in-between shifts ( at least 9 Hours ). If you feel like something else could be going on than certainly you're welcome to come for an office visit and we can see if we might need to do an evaluation for anemia, may be check your thyroid, etc. Hope this helps.   Dr. Judie PetitM    ----- Message -----   From: [name],Raymond Stewart   Sent: 05/01/2016 3:55 PM EDT    To:  Raymond GasserMETHENEY,Raymond Montalban, MD  Subject: Non-Urgent Medical Question   Hello Dr. Linford ArnoldMetheney,   Got a couple questions I want to run by you. I am having trouble staying awake during the day. At about 5 pm through midnight im ok. Anything before 5 pm I am constantly almost groggy, yawning. I am very irritable, it is taking a toll on my home life with my kids and wife and starting to impact my work life. I feel like I have little to no energy. I am thinking it has something to do with my shift schedule. I work a 12 hour rapid rotation with no set hours. I work 2 days on dayshift 12 hours, off one day and come back on nightshift for 3 days 12 hours, off 2 days and repeat process. Sometimes I work 12 pm to 12 am or 8 am to 8 pm. What can I do to help with this?   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

## 2016-05-21 NOTE — Telephone Encounter (Signed)
Left message for patient to call back to schedule an appointment.  °

## 2016-05-21 NOTE — Telephone Encounter (Signed)
Patient scheduled.

## 2016-06-11 ENCOUNTER — Encounter: Payer: Self-pay | Admitting: Family Medicine

## 2016-06-11 ENCOUNTER — Ambulatory Visit (INDEPENDENT_AMBULATORY_CARE_PROVIDER_SITE_OTHER): Payer: 59 | Admitting: Family Medicine

## 2016-06-11 VITALS — BP 127/70 | HR 83 | Wt 194.0 lb

## 2016-06-11 DIAGNOSIS — L723 Sebaceous cyst: Secondary | ICD-10-CM | POA: Diagnosis not present

## 2016-06-11 DIAGNOSIS — R5383 Other fatigue: Secondary | ICD-10-CM | POA: Diagnosis not present

## 2016-06-11 NOTE — Addendum Note (Signed)
Addended by: [name], CATHERINE D on: 06/11/2016 04:29 PM   Modules accepted: Orders

## 2016-06-11 NOTE — Patient Instructions (Signed)
consider taking a B complex and a vitamin D, 1000 international units daily, as well as regular exercise to improve energy levels.

## 2016-06-11 NOTE — Progress Notes (Addendum)
Subjective:    CC: Fatigue  HPI: Fatigue pt reports that he has felt like this [place] and changing shifts approx 6 months ago. Working 12 hours on his shift.   He works today just sent to night shifts per week. And a flip-flop. He's worked this scheduled before and it didn't make him fatigued. He says he is sleeping well when he does sleep. He gets about 78 hours. He says he occasionally snores but not regularly. He does use caffeine to get him going before he has to work. He just feels very low energy. He has been more irritable but denies feeling depressed.No other skin or hair symptoms. He feels like his diet is normal. He reports getting adequate levels of protein.  Patient comes in today complaining of bump on the left side of his neck. He said it started out with what looked like a pimple. He squeezed it several times and gotten stuff out of it. It has an odor to it. He says he just can't get it to completely go away. He says he feels like they're still something in it.   Past medical history, Surgical history, Family history not pertinant except as noted below, Social history, Allergies, and medications have been entered into the medical record, reviewed, and corrections made.   Review of Systems: No fevers, chills, night sweats, weight loss, chest pain, or shortness of breath.   Objective:    General: Well Developed, well nourished, and in no acute distress.  Neuro: Alert and oriented x3, extra-ocular muscles intact, sensation grossly intact.  HEENT: Normocephalic, atraumatic, OP is clear,  TMs and canals are normal.  No cervical LN.   Skin: Warm and dry, no rashes.he has a small approximately half a centimeter sebaceous cyst on the left side of the neck. Cardiac: Regular rate and rhythm, no murmurs rubs or gallops, no lower extremity edema.  Respiratory: Clear to auscultation bilaterally. Not using accessory muscles, speaking in full sentences Abd: normal BS, non  tender, no OM Ext: no LE edema. NROM.   Impression and Recommendations:    Fatigue-suspect related to shift shift work sleep disorder.Though we'll do some blood work just to rule out any other underlying causes such as anemia, B12 deficiency, thyroid problems etc. Will call with results once available. If labs are all normal then can consider taking a B complex and a vitamin D, 1000 international units daily as well as regular exercise to improve energy levels.  Sebaceous cyst left side of neck-we'll refer to dermatology for full excision.

## 2016-06-14 LAB — CBC
HCT: 45.1 % (ref 38.5–50.0)
Hemoglobin: 15.4 g/dL (ref 13.2–17.1)
MCH: 31.5 pg (ref 27.0–33.0)
MCHC: 34.1 g/dL (ref 32.0–36.0)
MCV: 92.2 fL (ref 80.0–100.0)
MPV: 9.9 fL (ref 7.5–12.5)
Platelets: 250 10*3/uL (ref 140–400)
RBC: 4.89 MIL/uL (ref 4.20–5.80)
RDW: 13.8 % (ref 11.0–15.0)
WBC: 9 10*3/uL (ref 3.8–10.8)

## 2016-06-14 LAB — COMPLETE METABOLIC PANEL WITH GFR
ALK PHOS: 68 U/L (ref 40–115)
ALT: 18 U/L (ref 9–46)
AST: 19 U/L (ref 10–40)
Albumin: 4.4 g/dL (ref 3.6–5.1)
BILIRUBIN TOTAL: 0.9 mg/dL (ref 0.2–1.2)
BUN: 10 mg/dL (ref 7–25)
CO2: 26 mmol/L (ref 20–31)
Calcium: 9.2 mg/dL (ref 8.6–10.3)
Chloride: 105 mmol/L (ref 98–110)
Creat: 0.86 mg/dL (ref 0.60–1.35)
GFR, Est African American: >89 mL/min (ref >=60)
GFR, Est Non African American: >89 mL/min (ref >=60)
GLUCOSE: 76 mg/dL (ref 65–99)
Potassium: 4.2 mmol/L (ref 3.5–5.3)
SODIUM: 141 mmol/L (ref 135–146)
TOTAL PROTEIN: 6.6 g/dL (ref 6.1–8.1)

## 2016-06-14 LAB — MAGNESIUM: Magnesium: 2.2 mg/dL (ref 1.5–2.5)

## 2016-06-14 LAB — VITAMIN B12: Vitamin B-12: 926 pg/mL (ref 200–1100)

## 2016-06-14 LAB — VITAMIN D 25 HYDROXY (VIT D DEFICIENCY, FRACTURES): Vit D, 25-Hydroxy: 31 ng/mL (ref 30–100)

## 2016-06-14 LAB — TSH: TSH: 1.18 m[IU]/L (ref 0.40–4.50)

## 2016-06-26 ENCOUNTER — Ambulatory Visit (INDEPENDENT_AMBULATORY_CARE_PROVIDER_SITE_OTHER): Payer: 59 | Admitting: Sports Medicine

## 2016-06-26 ENCOUNTER — Emergency Department
Admission: EM | Admit: 2016-06-26 | Discharge: 2016-06-26 | Disposition: A | Discharge status: Home / Self Care | Payer: 59 | Source: Home / Self Care | Attending: Family Medicine | Admitting: Family Medicine

## 2016-06-26 ENCOUNTER — Encounter: Payer: Self-pay | Admitting: *Deleted

## 2016-06-26 DIAGNOSIS — M7022 Olecranon bursitis, left elbow: Secondary | ICD-10-CM | POA: Diagnosis not present

## 2016-06-26 DIAGNOSIS — S7012XA Contusion of left thigh, initial encounter: Secondary | ICD-10-CM

## 2016-06-26 DIAGNOSIS — M79605 Pain in left leg: Secondary | ICD-10-CM | POA: Diagnosis not present

## 2016-06-26 DIAGNOSIS — M79652 Pain in left thigh: Secondary | ICD-10-CM

## 2016-06-26 DIAGNOSIS — M79602 Pain in left arm: Secondary | ICD-10-CM | POA: Diagnosis not present

## 2016-06-26 NOTE — ED Notes (Signed)
Pt reports swelling/fluid to left elbow without injury x 11 days. He also c/o left thigh pain x 1 week after falling onto a basketball druing a game. Using ice and heat @ home.

## 2016-06-26 NOTE — Assessment & Plan Note (Signed)
Traumatic, aspiration and injection as above, strap with compressive dressing. Return in one month.

## 2016-06-26 NOTE — Progress Notes (Signed)
Patient ID: Raymond LimerickRobert W Stewart, male   DOB: 08/26/1981, 35 y.o.   MRN: [id number]   Subjective:    I'm seeing this patient as a consultation for:  Junius FinnerErin O'Malley, [facility]   CC: L elbow swelling  HPI: Pt was seen in Urgent Care after missing his regularly scheduled sports medicine appointment.  He complains of L elbow swelling that began ten days ago when driving to the beach.  He says the elbow has been swollen ever since, similar to an episode of olecranon bursitis he had back in high school.  Denies any pain or loss or ROM.  Swelling has not gotten better or worse over the past week.  Has not tried anything to relieve swelling.    He also complains of one week of outer thigh pain after falling on a basketball last week.  He says he has limited range of motion in his L upper leg due to pain.  He has tried icing his leg without relief of symptoms.  Pain does not radiate.   Past medical history, Surgical history, Family history not pertinant except as noted below, Social history, Allergies, and medications have been entered into the medical record, reviewed, and no changes needed.   Review of Systems: No headache, visual changes, nausea, vomiting, diarrhea, constipation, dizziness, abdominal pain, skin rash, fevers, chills, night sweats, weight loss, swollen lymph nodes, body aches,  muscle aches, chest pain, shortness of breath, mood changes, visual or auditory hallucinations.   Objective:   General: Well Developed, well nourished, and in no acute distress.  Neuro/Psych: Alert and oriented x3, extra-ocular muscles intact, able to move all 4 extremities, sensation grossly intact. Skin: Warm and dry, no rashes noted.  Respiratory: Not using accessory muscles, speaking in full sentences, trachea midline.  Cardiovascular: Pulses palpable, no extremity edema. Abdomen: Does not appear distended. Left Elbow: Left elbow is swollen.  No ecchymoses or erythema.   Range of motion full pronation, supination,  flexion, extension. Strength is full to all of the above directions Stable to varus, valgus stress. Negative moving valgus stress test. No discrete areas of tenderness to palpation. Ulnar nerve does not sublux. Negative cubital tunnel Tinel's. L Hip: Decreased ROM on flexion, adduction, abduction.  TTP along L quadriceps.   Procedure: Real-time Ultrasound Guided aspiration/Injection of left olecranon bursa Device: GE Logiq E  Verbal informed consent obtained.  Time-out conducted.  Noted no overlying erythema, induration, or other signs of local infection.  Skin prepped in a sterile fashion.  Local anesthesia: Topical Ethyl chloride.  With sterile technique and under real time ultrasound guidance:  Using 18-gauge needle aspirated 6 mL of serosanguineous fluid, syringe switched and 1 mL kenalog 40, 1 mL lidocaine injected easily. The elbow was then strapped with compressive dressing. Completed without difficulty  Pain immediately resolved suggesting accurate placement of the medication.  Advised to call if fevers/chills, erythema, induration, drainage, or persistent bleeding.  Images permanently stored and available for review in the ultrasound unit.  Impression: Technically successful ultrasound guided injection.  Impression and Recommendations:   This case required medical decision making of moderate complexity.  1. Left olecranon bursitis - Aspirated 6 cc's of blood and injected steroids.  Will follow-up in 1 month.  2. Left Quadriceps strain - Reassurance.  Recommended ice and rest until symptoms resolve.

## 2016-06-26 NOTE — ED Provider Notes (Signed)
CSN: [id number]     Arrival date & time 06/26/16  0947 History   First MD Initiated Contact with Patient 06/26/16 1011     Chief Complaint  Patient presents with  . Elbow Problem  . thigh pain    (Consider location/radiation/quality/duration/timing/severity/associated sxs/prior Treatment) HPI  Raymond Stewart is a 35 y.o. male presenting to UC with c/o swelling and fluid collection in his Left elbow for about 11 days.  He notes he had similar swelling in his elbow in high school and needed to have it drained. Current symptoms started shortly after he drove to the beach, he does note resting his elbow on the armrest on the door while driving. No known injuries.  Denies pain. Pt requesting to have fluid drained as he missed appointment with Dr. Benjamin Stain earlier this morning.  Pt also c/o Left thigh pain that started about 1 week ago after he fell onto a basketball during a game.  He has been alternating ice at heat at home but no relief.  Pain is moderate, more severe when palpated. He has noticed mild bruising.     Past Medical History  Diagnosis Date  . Smoker 09/04/2012  . ANKLE PAIN, RIGHT 09/04/2010    Qualifier: Diagnosis of  By: Orson Aloe MD, Tinnie Gens     Past Surgical History  Procedure Laterality Date  . Right knee bone spurs  6-00  . Left hand  8-08   Family History  Problem Relation Age of Onset  . Hypertension Father   . Heart disease Father   . Heart attack Father   . Cancer Other     lung  . Diabetes Other    Social History  Substance Use Topics  . Smoking status: Current Every Day Smoker -- 0.50 packs/day for 12 years  . Smokeless tobacco: None  . Alcohol Use: No    Review of Systems  Musculoskeletal: Positive for myalgias, joint swelling and arthralgias.  Skin: Negative for color change and wound.  Neurological: Negative for weakness and numbness.    Allergies  Review of patient's allergies indicates no known allergies.  Home Medications   Prior  to Admission medications   Not on File   Meds Ordered and Administered this Visit  Medications - No data to display  BP 125/86 mmHg  Pulse 86  Temp(Src) 97.7 F (36.5 C) (Oral)  Wt 196 lb (88.905 kg)  SpO2 98% No data found.   Physical Exam  Constitutional: He is oriented to person, place, and time. He appears well-developed and well-nourished.  HENT:  Head: Normocephalic and atraumatic.  Eyes: EOM are normal.  Neck: Normal range of motion.  Cardiovascular: Normal rate.   Pulmonary/Chest: Effort normal.  Musculoskeletal: Normal range of motion. He exhibits edema and tenderness.  Left elbow: mild to moderate edema over olecranon bursa. Non-tender. Full ROM. Left thigh: mild edema, tenderness to anterior/lateral aspect. Full ROM hip and knee.   Neurological: He is alert and oriented to person, place, and time.  Skin: Skin is warm and dry.  Left elbow: skin in tact. No ecchymosis, erythema or warmth.  Left thigh: faint ecchymosis. Skin in tact.   Psychiatric: He has a normal mood and affect. His behavior is normal.  Nursing note and vitals reviewed.   ED Course  Procedures (including critical care time)  Labs Review Labs Reviewed - No data to display  Imaging Review No results found.   MDM   1. Olecranon bursitis of left elbow   2. Thigh  pain, musculoskeletal, left   3. Thigh contusion, left, initial encounter    Pt c/o Left elbow swelling and tenderness to Left thigh.  Consulted with Dr. Benjamin Stainhekkekandam, Sports Medicine, who also examined and treated pt. Refer to consult note.  Thigh pain due to contusion. Encouraged continued icing and gentle massage. Home care instructions provided.      Junius Finnerrin O'Malley, PA-C 06/26/16 1104

## 2016-08-27 ENCOUNTER — Ambulatory Visit (INDEPENDENT_AMBULATORY_CARE_PROVIDER_SITE_OTHER): Payer: 59 | Admitting: Family Medicine

## 2016-08-27 ENCOUNTER — Encounter: Payer: Self-pay | Admitting: Family Medicine

## 2016-08-27 ENCOUNTER — Encounter (HOSPITAL_COMMUNITY): Payer: Self-pay | Admitting: *Deleted

## 2016-08-27 ENCOUNTER — Telehealth: Payer: Self-pay | Admitting: *Deleted

## 2016-08-27 VITALS — BP 138/80 | HR 95 | Wt 193.0 lb

## 2016-08-27 DIAGNOSIS — F430 Acute stress reaction: Secondary | ICD-10-CM | POA: Diagnosis not present

## 2016-08-27 NOTE — Progress Notes (Signed)
Subjective:    CC: Needs "OK" for return to work.   HPI: 35 year old male comes in today to discuss some problems that are going on. Unfortunately he is having a lot of palms with his 35 year old daughter. She's been stealing things from stores and running away from home. Twice she has had the police called on the family saying that they are beating her and abusing her. She'll services been out to their house as well as the police a couple of times. He's just at the point where he doesn't know what to do. He and his wife have even looked into a couple of boarding type schools to see if this might be a good option but he really doesn't want to do this. He is just feeling anxious and overwhelmed and tearful. He's not sleeping well at all. His mind is racing. As a matter fact he was sent home from work last week and encouraged to take some time off work. He operates heavy machinery daily and they are worried that he may injure himself or someone else.  BP 138/80 (BP Location: Right Arm, Patient Position: Sitting, Cuff Size: Normal)   Pulse 95   Wt 193 lb (87.5 kg)   SpO2 100%   BMI 24.78 kg/m     No Known Allergies  Past Medical History:  Diagnosis Date  . ANKLE PAIN, RIGHT 09/04/2010   Qualifier: Diagnosis of  By: Orson AloeHenderson MD, Tinnie GensJeffrey    . Smoker 09/04/2012    Past Surgical History:  Procedure Laterality Date  . left hand  8-08  . right knee bone spurs  6-00    Social History   Social History  . Marital status: Married    Spouse name: N/A  . Number of children: N/A  . Years of education: N/A   Occupational History  . Not on file.   Social History Main Topics  . Smoking status: Current Every Day Smoker    Packs/day: 0.50    Years: 12.00  . Smokeless tobacco: Not on file  . Alcohol use No  . Drug use: No  . Sexual activity: Yes   Other Topics Concern  . Not on file   Social History Narrative  . No narrative on file    Family History  Problem Relation Age of Onset   . Hypertension Father   . Heart disease Father   . Heart attack Father   . Cancer Other     lung  . Diabetes Other     No outpatient encounter prescriptions on file as of 08/27/2016.   No facility-administered encounter medications on file as of 08/27/2016.      .   Objective:    General: Well Developed, well nourished, and in no acute distress.  Neuro: Alert and oriented x3, extra-ocular muscles intact, sensation grossly intact.  HEENT: Normocephalic, atraumatic  Skin: Warm and dry, no rashes. Cardiac: Regular rate and rhythm, no murmurs rubs or gallops, no lower extremity edema.  Respiratory: Clear to auscultation bilaterally. Not using accessory muscles, speaking in full sentences.   Impression and Recommendations:   Acute situational stress-discussed options. He has already contacted IEP through work and they have and hopefully get him scheduled with a counselor this week. Strongly encouraged him to keep his appointments and help him figure out what he needs to do to keep himself mentally healthy and to help his daughter. They're actually trying to find a new counselor/psychiatrist for her as well. She was previously diagnosed with depression.  Going to go ahead and write him out for the next 3 weeks to work on getting in with a therapist. We also discussed medication as an option but at this point recommend hold off the certainly if he is feeling overwhelmed and still continued to have difficulty with sleep and we need to consider this as an option. I will see him back in about 3 weeks right before he is to return to work just to make sure that he is safe to do so. We'll complete his FMLA paperwork as above.  Time spent 30 minutes, gradient 35% of the time spent counseling about acute situational stress.

## 2016-08-27 NOTE — Telephone Encounter (Signed)
Forms completed,faxed, copied,scanned, confirmation received.Tykira Wachs Lynetta  

## 2016-09-03 ENCOUNTER — Encounter: Payer: Self-pay | Admitting: Family Medicine

## 2016-09-05 ENCOUNTER — Telehealth: Payer: Self-pay | Admitting: *Deleted

## 2016-09-05 NOTE — Telephone Encounter (Signed)
Disability forms completed, copied, faxed, scanned and confirmation received.Raymond PacasBarkley, Raymond Stewart Raymond Stewart \

## 2016-09-07 ENCOUNTER — Telehealth: Payer: Self-pay | Admitting: *Deleted

## 2016-09-07 NOTE — Telephone Encounter (Signed)
Pt called and lvm asking that he be released to return to work on 09/11/16. I called back and lvm informing him that letter will be written and left up front for p/u.Loralee PacasBarkley, Reniyah Gootee LangdonLynetta

## 2016-09-11 ENCOUNTER — Telehealth: Payer: Self-pay | Admitting: *Deleted

## 2016-09-11 NOTE — Telephone Encounter (Signed)
Disability forms completed, faxed,scanned,copy made,comfirmation received.Loralee PacasBarkley, Suttyn Cryder EnolaLynetta

## 2016-09-17 ENCOUNTER — Encounter: Payer: Self-pay | Admitting: Family Medicine

## 2016-09-17 ENCOUNTER — Ambulatory Visit (INDEPENDENT_AMBULATORY_CARE_PROVIDER_SITE_OTHER): Payer: 59 | Admitting: Family Medicine

## 2016-09-17 VITALS — BP 116/64 | HR 77 | Wt 198.0 lb

## 2016-09-17 DIAGNOSIS — M7021 Olecranon bursitis, right elbow: Secondary | ICD-10-CM | POA: Diagnosis not present

## 2016-09-17 DIAGNOSIS — F430 Acute stress reaction: Secondary | ICD-10-CM

## 2016-09-17 NOTE — Progress Notes (Signed)
   Subjective:    Patient ID: Raymond Stewart, male    DOB: Sep 14, 1981, 35 y.o.   MRN: [id number]  HPI F/U acute stress - he is doing better. His daughter is now staying with his parents and they have her in a new school and she is doing better. They are no longer arguing and there is less stress for the other children in the house.  He is sleeping better. He is now back at work full time.  He is not on medications.   Right olecranon bursitis - he was previously javing problems on the left and saw Dr T and had the fluid drained.  Now having swelling on the right.  Says really not that painful. He does put a lot of pressure on his elbows when he is at work.    Review of Systems     Objective:   Physical Exam  Constitutional: He is oriented to person, place, and time. He appears well-developed and well-nourished.  HENT:  Head: Normocephalic and atraumatic.  Eyes: Conjunctivae and EOM are normal.  Cardiovascular: Normal rate.   Pulmonary/Chest: Effort normal.  Musculoskeletal:  Olecranon swelling on the right.  nontender on exam. No erythema.    Neurological: He is alert and oriented to person, place, and time.  Skin: Skin is dry. No pallor.  Psychiatric: He has a normal mood and affect. His behavior is normal.  Vitals reviewed.      Assessment & Plan:  Acute stress - Continue to work on ways to reduce stress including working on good sleep quality. It sounds they have made some arrangements with his daughter and that has really helped improve the home life as well.  Right olecranon bursitis - gave reassurance. He is really not ahving a lot of pain with it. Recommend compression and NSAID prn.

## 2016-09-28 ENCOUNTER — Encounter: Payer: Self-pay | Admitting: Sports Medicine

## 2016-09-28 ENCOUNTER — Ambulatory Visit (INDEPENDENT_AMBULATORY_CARE_PROVIDER_SITE_OTHER): Payer: 59 | Admitting: Sports Medicine

## 2016-09-28 DIAGNOSIS — M7021 Olecranon bursitis, right elbow: Secondary | ICD-10-CM | POA: Diagnosis not present

## 2016-09-28 NOTE — Progress Notes (Signed)
   Subjective:    I'm seeing this patient as a consultation for:  Dr. Nani Gasseratherine Metheney  CC: Right elbow swelling  HPI: This is a pleasant 35 year old male, he has a history of left olecranon bursitis, the present problem swelling on the olecranon of the right elbow, nonpainful, no trauma. No redness or infection. Symptoms are moderate, persistent and he desires interventional treatment today on the side.  Past medical history:  Negative.  See flowsheet/record as well for more information.  Surgical history: Negative.  See flowsheet/record as well for more information.  Family history: Negative.  See flowsheet/record as well for more information.  Social history: Negative.  See flowsheet/record as well for more information.  Allergies, and medications have been entered into the medical record, reviewed, and no changes needed.   Review of Systems: No headache, visual changes, nausea, vomiting, diarrhea, constipation, dizziness, abdominal pain, skin rash, fevers, chills, night sweats, weight loss, swollen lymph nodes, body aches, joint swelling, muscle aches, chest pain, shortness of breath, mood changes, visual or auditory hallucinations.   Objective:   General: Well Developed, well nourished, and in no acute distress.  Neuro/Psych: Alert and oriented x3, extra-ocular muscles intact, able to move all 4 extremities, sensation grossly intact. Skin: Warm and dry, no rashes noted.  Respiratory: Not using accessory muscles, speaking in full sentences, trachea midline.  Cardiovascular: Pulses palpable, no extremity edema. Abdomen: Does not appear distended. Right Elbow: Visibly swollen and nontender olecranon bursitis Range of motion full pronation, supination, flexion, extension. Strength is full to all of the above directions Stable to varus, valgus stress. Negative moving valgus stress test. No discrete areas of tenderness to palpation. Ulnar nerve does not sublux. Negative cubital  tunnel Tinel's.  Procedure: Real-time Ultrasound Guided aspiration/Injection of right olecranon bursa Device: GE Logiq E  Verbal informed consent obtained.  Time-out conducted.  Noted no overlying erythema, induration, or other signs of local infection.  Skin prepped in a sterile fashion.  Local anesthesia: Topical Ethyl chloride.  With sterile technique and under real time ultrasound guidance:  Using 18-gauge needle aspirated 7 mL of serosanguineous fluid, syringe switched and 1 mL kenalog 40, 1 mL lidocaine injected easily. Completed without difficulty  Pain immediately resolved suggesting accurate placement of the medication.  Advised to call if fevers/chills, erythema, induration, drainage, or persistent bleeding.  Images permanently stored and available for review in the ultrasound unit.  Impression: Technically successful ultrasound guided injection.  The elbow was strapped with compressive dressing  Impression and Recommendations:   This case required medical decision making of moderate complexity.  Olecranon bursitis, right elbow Left elbow is okay, now having a new problem, olecranon bursitis on the right. Aspiration and injection as above, strapped with compressive dressing, return to see me in one month.

## 2016-09-28 NOTE — Assessment & Plan Note (Signed)
Left elbow is okay, now having a new problem, olecranon bursitis on the right. Aspiration and injection as above, strapped with compressive dressing, return to see me in one month.

## 2016-10-30 ENCOUNTER — Ambulatory Visit: Payer: 59 | Admitting: Sports Medicine

## 2017-04-03 ENCOUNTER — Telehealth: Payer: Self-pay | Admitting: Family Medicine

## 2017-04-03 NOTE — Telephone Encounter (Signed)
Please call patient. He has made a my chart appointment for Friday and the description sounds very concerning. This patient may need immediate help from behavioral health. Please call him and see if we will be able to get him quickly evaluated this week downstairs. I'm concerned about possible suicidal ideation.

## 2017-04-03 NOTE — Telephone Encounter (Signed)
Called pt and lvm asking him to rtn call ASAP and to speak to a TRIAGE nurse w/regards to his appt on Friday.Loralee Pacas [place]aulding

## 2017-04-05 ENCOUNTER — Encounter: Payer: Self-pay | Admitting: Family Medicine

## 2017-04-05 ENCOUNTER — Ambulatory Visit (INDEPENDENT_AMBULATORY_CARE_PROVIDER_SITE_OTHER): Payer: 59 | Admitting: Family Medicine

## 2017-04-05 VITALS — BP 118/67 | HR 85 | Wt 189.0 lb

## 2017-04-05 DIAGNOSIS — F39 Unspecified mood [affective] disorder: Secondary | ICD-10-CM | POA: Diagnosis not present

## 2017-04-05 MED ORDER — QUETIAPINE FUMARATE 50 MG PO TABS: ORAL_TABLET | ORAL | 0 refills | Status: DC

## 2017-04-05 NOTE — Telephone Encounter (Signed)
Pt was seen in office today

## 2017-04-05 NOTE — Progress Notes (Signed)
Subjective:    Patient ID: Raymond Stewart, male    DOB: Sep 25, 1981, 36 y.o.   MRN: [id number]  HPI 36 year old male comes in today to discuss his mood. He is just feeling overwhelmed irritable and very depressed. He reports little pleasure in interesting things more than half the days and feeling down nearly every day. He also reports difficulty concentrating. 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 Sometimes he will travel for work as well. In fact he has a training session coming up soon. He he started had sleep problems for quite a long time well over a year. Infected mention that when I saw him about a year ago. More recently he had started taking Adderall from friends at work to help him stay awake at work and drinking lots of caffeine and power drinks. He felt like it was causing some major mood swings. Some days he would feel great and do well and then other days would feel extremely down and depressed and hopeless. He says right now he is battling lots of feelings of anger and sadness.  His ter has been very ill and in the ICU in [place] he asdwn there recently to be with him. He is haty with home stressors with cilren d hs wi. h says he feels uloved bu oesn'twant o ose hisfe. He denies any alcohol use and does smoke. In fact he s's ben smoking way more he is very irritable and short tempered at work. Sometimes he feels like he is the only one that can going on and can fix things. He says even at home he notices is extremely irritable with his family. He says they have mostly just been avoiding him and not talking to him because he can gets angry so quickly. He says he feels like his family is walking on egg shells. He says even spending time with his dogs which he loves, gets on his nerves it aggravates him. I have treated him in years before for generalized anxiety disorder.  He is actually lost about 10 pounds since he was in  our office back in the fall.   Review of Systems   BP 118/67   Pulse 85   Wt 189 lb (85.7 kg)   BMI 24.27 kg/m     No Known Allergies  Past Medical History:  Diagnosis Date  . ANKLE PAIN, RIGHT 09/04/2010   Qualifier: Diagnosis of  By: Orson Aloe MD, Tinnie Gens    . Smoker 09/04/2012    Past Surgical History:  Procedure Laterality Date  . left hand  8-08  . right knee bone spurs  6-00    Social History   Social History  . Marital status: Married    Spouse name: N/A  . Number of children: N/A  . Years of education: N/A   Occupational History  . Not on file.   Social History Main Topics  . Smoking status: Current Every Day Smoker    Packs/day: 0.50    Years: 12.00  . Smokeless tobacco: Never Used  . Alcohol use No  . Drug use: No  . Sexual activity: Yes   Other Topics Concern  . Not on file   Social History Narrative  . No narrative on file    Family History  Problem Relation Age of Onset  . Hypertension Father   . Heart disease Father   . Heart attack Father   . Cancer Other  lung  . Diabetes Other     Outpatient Encounter Prescriptions as of 04/05/2017  Medication Sig  . QUEtiapine (SEROQUEL) 50 MG tablet One tab QHS x 2 night, then increase to BID x 2 nights. Then increase to 2 po QHS and 1 AM x 2 days then increase to 2 tabs po BID.  . [DISCONTINUED] traMADol (ULTRAM) 50 MG tablet    No facility-administered encounter medications on file as of 04/05/2017.           Objective:   Physical Exam  Constitutional: He is oriented to person, place, and time. He appears well-developed and well-nourished.  HENT:  Head: Normocephalic and atraumatic.  Eyes: Conjunctivae and EOM are normal.  Cardiovascular: Normal rate.   Pulmonary/Chest: Effort normal.  Neurological: He is alert and oriented to person, place, and time.  Skin: Skin is dry. No pallor.  Psychiatric: He has a normal mood and affect. His behavior is normal.  Vitals  reviewed.         Assessment & Plan:  Mood disorder unspecified-PHQ 9 score of 21 today and got 7 score of 21. I also had him complete a mood disorder questionnaire and he screened positive. We reviewed this results today and discuss treatment. I want to get him in with a therapist/counselor and possibly psychiatry. We were able to get him an appointment early next week before he leaves town for work. We also discussed medication options. The go ahead and start him on a Seroquel taper and try to get him up to 100 mg twice a day.. I would like to get psychiatry involved for more formal diagnosis of his underlying mood disorder.  Discussed avoiding any type of controlled substance or even over-the-counter medications at this point. I explained how these medications can interfere with bring chemicals and can affect the potential effectiveness of her prescription medications. He reassured me that he was not actively suicidal and did not have a plan.  Time spent 25 in, > 50% spent counseling about mood and plan for treatment.

## 2017-04-09 ENCOUNTER — Ambulatory Visit (INDEPENDENT_AMBULATORY_CARE_PROVIDER_SITE_OTHER): Payer: 59 | Admitting: Psychology

## 2017-04-09 DIAGNOSIS — F321 Major depressive disorder, single episode, moderate: Secondary | ICD-10-CM | POA: Diagnosis not present

## 2017-04-12 ENCOUNTER — Encounter: Payer: Self-pay | Admitting: Family Medicine

## 2017-04-14 MED ORDER — PAROXETINE HCL 20 MG PO TABS: ORAL_TABLET | ORAL | 0 refills | Status: DC

## 2017-04-15 ENCOUNTER — Ambulatory Visit (INDEPENDENT_AMBULATORY_CARE_PROVIDER_SITE_OTHER): Payer: 59 | Admitting: Psychology

## 2017-04-15 DIAGNOSIS — F321 Major depressive disorder, single episode, moderate: Secondary | ICD-10-CM

## 2017-04-19 ENCOUNTER — Encounter: Payer: Self-pay | Admitting: Family Medicine

## 2017-04-24 ENCOUNTER — Telehealth: Payer: Self-pay | Admitting: Family Medicine

## 2017-04-24 NOTE — Telephone Encounter (Addendum)
Please let him know the FMLA is complete for him to pick up.

## 2017-04-24 NOTE — Telephone Encounter (Signed)
Left VM advising Pt FMLA paperwork ready for pick up, it has been placed up front.

## 2017-04-30 ENCOUNTER — Ambulatory Visit (INDEPENDENT_AMBULATORY_CARE_PROVIDER_SITE_OTHER): Payer: 59 | Admitting: Psychology

## 2017-04-30 DIAGNOSIS — F321 Major depressive disorder, single episode, moderate: Secondary | ICD-10-CM | POA: Diagnosis not present

## 2017-05-03 ENCOUNTER — Ambulatory Visit (INDEPENDENT_AMBULATORY_CARE_PROVIDER_SITE_OTHER): Payer: 59 | Admitting: Family Medicine

## 2017-05-03 ENCOUNTER — Telehealth: Payer: Self-pay | Admitting: Family Medicine

## 2017-05-03 ENCOUNTER — Encounter: Payer: Self-pay | Admitting: Family Medicine

## 2017-05-03 VITALS — BP 121/70 | HR 88 | Ht 68.0 in | Wt 186.0 lb

## 2017-05-03 DIAGNOSIS — F39 Unspecified mood [affective] disorder: Secondary | ICD-10-CM

## 2017-05-03 MED ORDER — QUETIAPINE FUMARATE 100 MG PO TABS: 100.0000 mg | ORAL_TABLET | Freq: Two times a day (BID) | ORAL | 1 refills | Status: DC

## 2017-05-03 MED ORDER — PAROXETINE HCL 30 MG PO TABS: 30.0000 mg | ORAL_TABLET | Freq: Every day | ORAL | 1 refills | Status: DC

## 2017-05-03 NOTE — Progress Notes (Signed)
   Subjective:    Patient ID: Raymond LimerickRobert W Dessert, male    DOB: 08-Mar-1981, 36 y.o.   MRN: [id number]  HPI Mood Disorder - He is here today to follow-up on mood disorder. Unfortunately he still was expressing a lot of stress in his personal home life. His wife and daughter called DSS. And now they have been involved. They're wanting him to do a full assessment. Someone had reported that he was drinking alcohol and using marijuana. He said he is not drinking alcohol in several months and has not used marijuana in several years. He was taking a friend's Adderall for a short period of time occasionally but says he has not done that in a while as well. He is now living with his parents because his wife took out a restraining order on him. He says he really just doesn't understand. He really loves endorses wife and felt like they had an overall good relationship. He feels like his daughter is very disruptive to the home life. She runs away and has been caught stealing. He did start going to therapy since I last saw him and says that he has been for 3 visits so far with Aurther Lofterry over at [facility]. He has found this very helpful. He's also been taking his medication and has been doing well with it. He says it has helped him sleep a little better.   Review of Systems     Objective:   Physical Exam  Constitutional: He is oriented to person, place, and time. He appears well-developed and well-nourished.  HENT:  Head: Normocephalic and atraumatic.  Cardiovascular: Normal rate, regular rhythm and normal heart sounds.   Pulmonary/Chest: Effort normal and breath sounds normal.  Neurological: He is alert and oriented to person, place, and time.  Skin: Skin is warm and dry.  Psychiatric: His behavior is normal. Judgment and thought content normal.  + tearful        Assessment & Plan:  Mood disorder-he has been to 3 therapy sessions. Will increase his Paxil to 30 mg. Change Seroquel 200 mg twice a  day which is the dose he is currently taking he is just taking the lower strength tabs. Offered support. Encouraged him to call or go to ED if starts to have thoughts of harming himself. I would still like for him to see a psychiatrist as for more formal diagnosis and to make sure that we have him on the best medication regimen.  Time spent 20 min, > 50% spent counseling about mood.

## 2017-05-03 NOTE — Telephone Encounter (Signed)
Patient is actually seeing Raymond Stewart over [facility]. I would also like him to be seen by their psychiatrist at some point just to make sure that medications currently are a best fit for him and to make sure that we have the right diagnosis. Please call them and see if they can also schedule him an appointment with her psychiatrist in addition to his visits with Terry/the therapist.\\Kaydence Baba Linford ArnoldMetheney, MD

## 2017-05-06 ENCOUNTER — Encounter: Payer: Self-pay | Admitting: Family Medicine

## 2017-05-14 ENCOUNTER — Ambulatory Visit: Payer: Self-pay | Admitting: Psychology

## 2017-05-28 ENCOUNTER — Ambulatory Visit: Payer: Self-pay | Admitting: Psychology

## 2017-05-31 ENCOUNTER — Ambulatory Visit: Payer: Self-pay | Admitting: Family Medicine

## 2017-05-31 DIAGNOSIS — Z0189 Encounter for other specified special examinations: Secondary | ICD-10-CM

## 2017-05-31 NOTE — Progress Notes (Deleted)
   Subjective:    Patient ID: Raymond Stewart, male    DOB: 1980-12-28, 36 y.o.   MRN: [id number]  HPI Follow-up generalized anxiety disorder. Increase his Paxil and sertraline at last office visit proximally 4 weeks ago. At that point in time he is already attended 3 therapy sessions.   Review of Systems     Objective:   Physical Exam        Assessment & Plan:  GAD-

## 2018-07-24 ENCOUNTER — Encounter: Payer: Self-pay | Admitting: Family Medicine

## 2018-07-25 ENCOUNTER — Telehealth: Payer: Self-pay | Admitting: Family Medicine

## 2018-07-25 NOTE — Telephone Encounter (Signed)
You're welcome!

## 2018-07-25 NOTE — Telephone Encounter (Signed)
Mood swings, sleep regularities with swing shift, Nurse reccomends ADHD testing. I also need a tetanus shot. I have this pt scheduled in a 20 min slot is this okay?

## 2018-07-25 NOTE — Telephone Encounter (Signed)
That is perfect.  Thank you

## 2018-07-28 ENCOUNTER — Ambulatory Visit: Payer: 59 | Admitting: Family Medicine

## 2018-07-28 ENCOUNTER — Encounter: Payer: Self-pay | Admitting: Family Medicine

## 2018-07-28 VITALS — BP 129/73 | HR 102 | Ht 68.0 in | Wt 201.0 lb

## 2018-07-28 DIAGNOSIS — R41840 Attention and concentration deficit: Secondary | ICD-10-CM

## 2018-07-28 DIAGNOSIS — R4586 Emotional lability: Secondary | ICD-10-CM

## 2018-07-28 DIAGNOSIS — Z23 Encounter for immunization: Secondary | ICD-10-CM | POA: Diagnosis not present

## 2018-07-28 DIAGNOSIS — R454 Irritability and anger: Secondary | ICD-10-CM | POA: Diagnosis not present

## 2018-07-28 DIAGNOSIS — B360 Pityriasis versicolor: Secondary | ICD-10-CM | POA: Diagnosis not present

## 2018-07-28 MED ORDER — KETOCONAZOLE 2 % EX CREA: 1.0000 "application " | TOPICAL_CREAM | Freq: Every day | CUTANEOUS | 0 refills | Status: DC

## 2018-07-28 NOTE — Progress Notes (Addendum)
Subjective:    Patient ID: Raymond Stewart, male    DOB: Jan 21, 1981, 37 y.o.   MRN: [id number]  HPI  37 year old male comes in today with some concerns about his mood and possible ADD.  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  But for the past year he is just had a lot of problems.  He has been really irritable at home and having some difficulties with his wife.  In fact he actually did see a psychologist about a year ago when a lot of this first started.  He eventually quit going.  We actually had him on Seroquel at the time to help with some sleep issues that he was having.  He was also on Paxil.  He reports that his sleep is just off.  And normally did not have any problems with with a swing shift again until this past year.  He denies any suicidal thoughts.  At work he just gets overwhelmed.  He has a lot to complete while he is there and people come up to him and interrupt.  Then he will sometimes skip things thinking that he will come back to it and then by the end of the day he just gets overwhelmed with what he is done and what he has not done and ends up running around like crazy before the end of his shift.  Both of his children have been diagnosed with ADHD as well as bipolar disorder.  He himself reports sometimes making rash decisions like deciding he wants to put a pull in the house more recently.  His wife actually talked him out of it.  Start projects and then not finish them for sometimes months to years.  He also reports that at night when he gets ready to go to bed he will feel like he needs to tense his legs and then relax them and then tends them and then relax them.  Sometimes he will just shake his leg back and forth.   He also has noticed a few little whitish circular areas on his forearms and chest and abdomen.  He says his wife has  similar lesions on her back.  It is are not bothersome or itchy.  Review of Systems  BP 129/73   Pulse (!) 102   Ht 5\' 8"  (1.727 m)   Wt 201 lb (91.2 kg)   SpO2 100%   BMI 30.56 kg/m     No Known Allergies  Past Medical History:  Diagnosis Date  . ANKLE PAIN, RIGHT 09/04/2010   Qualifier: Diagnosis of  By: Orson AloeHenderson MD, Tinnie GensJeffrey    . Smoker 09/04/2012    Past Surgical History:  Procedure Laterality Date  . left hand  8-08  . right knee bone spurs  6-00    Social History   Socioeconomic History  . Marital status: Married    Spouse name: Not on file  . Number of children: Not on file  . Years of education: Not on file  . Highest education level: Not on file  Occupational History  . Not on file  Social Needs  . Financial resource strain: Not on file  . Food insecurity:    Worry: Not on file    Inability: Not on file  . Transportation needs:    Medical: Not  on file    Non-medical: Not on file  Tobacco Use  . Smoking status: Current Every Day Smoker    Packs/day: 0.50    Years: 12.00    Pack years: 6.00  . Smokeless tobacco: Never Used  Substance and Sexual Activity  . Alcohol use: No  . Drug use: No  . Sexual activity: Yes  Lifestyle  . Physical activity:    Days per week: Not on file    Minutes per session: Not on file  . Stress: Not on file  Relationships  . Social connections:    Talks on phone: Not on file    Gets together: Not on file    Attends religious service: Not on file    Active member of club or organization: Not on file    Attends meetings of clubs or organizations: Not on file    Relationship status: Not on file  . Intimate partner violence:    Fear of current or ex partner: Not on file    Emotionally abused: Not on file    Physically abused: Not on file    Forced sexual activity: Not on file  Other Topics Concern  . Not on file  Social History Narrative  . Not on file    Family History  Problem Relation Age of Onset  .  Hypertension Father   . Heart disease Father   . Heart attack Father   . Cancer Other        lung  . Diabetes Other     Outpatient Encounter Medications as of 07/28/2018  Medication Sig  . ketoconazole (NIZORAL) 2 % cream Apply 1 application topically daily.  . [DISCONTINUED] PARoxetine (PAXIL) 30 MG tablet Take 1 tablet (30 mg total) by mouth daily.  . [DISCONTINUED] QUEtiapine (SEROQUEL) 100 MG tablet Take 1 tablet (100 mg total) by mouth 2 (two) times daily.   No facility-administered encounter medications on file as of 07/28/2018.          Objective:   Physical Exam  Constitutional: He is oriented to person, place, and time. He appears well-developed and well-nourished.  HENT:  Head: Normocephalic and atraumatic.  Eyes: Conjunctivae and EOM are normal.  Cardiovascular: Normal rate.  Pulmonary/Chest: Effort normal.  Neurological: He is alert and oriented to person, place, and time.  Skin: Skin is dry. No pallor.  Psychiatric: He has a normal mood and affect. His behavior is normal.  Vitals reviewed.      Assessment & Plan:   Inattention-refer to psychiatry.  I do think he needs more formal diagnosis for this condition as I do think he may have some overlapping things going on.  Would be leery to put him on a stimulant if he actually does have underlying bipolar disorder.  Mood swings with irritability-unclear etiology.  It does sound like he has some hypomania.  Will refer to psychiatry for further evaluation.  Mood score questionnaire positive for 14 questions all but the social question.  He rates it as a serious problem and he has immediate family members that have been diagnosed with bipolar disorder.  Rash-most consistent with tinea versicolor.  We will treat with ketoconazole cream.  Call if not re-improving or resolving.

## 2018-07-29 ENCOUNTER — Encounter: Payer: Self-pay | Admitting: Family Medicine

## 2018-09-09 ENCOUNTER — Ambulatory Visit (INDEPENDENT_AMBULATORY_CARE_PROVIDER_SITE_OTHER): Payer: 59 | Admitting: Psychiatry

## 2018-09-09 ENCOUNTER — Encounter (HOSPITAL_COMMUNITY): Payer: Self-pay | Admitting: Psychiatry

## 2018-09-09 VITALS — BP 128/76 | HR 93 | Ht 68.0 in | Wt 199.0 lb

## 2018-09-09 DIAGNOSIS — G4700 Insomnia, unspecified: Secondary | ICD-10-CM

## 2018-09-09 DIAGNOSIS — F411 Generalized anxiety disorder: Secondary | ICD-10-CM

## 2018-09-09 DIAGNOSIS — F0630 Mood disorder due to known physiological condition, unspecified: Secondary | ICD-10-CM | POA: Diagnosis not present

## 2018-09-09 DIAGNOSIS — Z818 Family history of other mental and behavioral disorders: Secondary | ICD-10-CM | POA: Diagnosis not present

## 2018-09-09 DIAGNOSIS — F17210 Nicotine dependence, cigarettes, uncomplicated: Secondary | ICD-10-CM

## 2018-09-09 DIAGNOSIS — G4726 Circadian rhythm sleep disorder, shift work type: Secondary | ICD-10-CM | POA: Diagnosis not present

## 2018-09-09 MED ORDER — ZOLPIDEM TARTRATE 10 MG PO TABS: 10.0000 mg | ORAL_TABLET | Freq: Every evening | ORAL | 0 refills | Status: DC | PRN

## 2018-09-09 MED ORDER — BUPROPION HCL ER (SR) 100 MG PO TB12: 100.0000 mg | ORAL_TABLET | Freq: Every day | ORAL | 0 refills | Status: DC

## 2018-09-09 NOTE — Patient Instructions (Signed)
Refer to therapy 

## 2018-09-09 NOTE — Progress Notes (Signed)
Psychiatric Initial Adult Assessment   Patient Identification: Raymond Stewart MRN:  [id number] Date of Evaluation:  09/09/2018 Referral Source: Dr. Linford ArnoldMetheney Chief Complaint:   Chief Complaint    Establish Care; Other     Visit Diagnosis:    ICD-10-CM   1. Mood disorder in conditions classified elsewhere F06.30   2. GAD (generalized anxiety disorder) F41.1   3. 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  Referred for management of mood symptoms irritability anxiety worries and poor sleep related to his shift work  Patient was here with his wife he works at [employer] shift work of 12 hours which are irregular sometimes he has to work 3 days involving the daytime than 2 or 3 days in a row at nighttime but more recently shift are  more irregular that is affecting his sleep is causing him to feel more tired during the day yesterday July a lot of coffee and nicotine.  It is affecting his spending time with the kids and relationship specially last year he was also drinking that added up to more complex and more irritability and mood swings he was effecting relationship. wihich  is getting better wife is supportive but  He  still endorses fatigue during the day cannot sleep because of the irregular sleep cycles and has to rule out a lot of coffee during the daytime mood remains irritable easily upset anxious and worry for about not able to spend much time with the family he does not like his job because of the recent changes in your people coming in.  Endorses  sad days at times then endorses worries excessive related to his job shift work and poor sleep.  Modifying factors supportive 5 3 kids  Duration on and off for a couple of years.  Last year he had seen a psychologist for 1-2 times but did not continue says that he does not connected with that therapy and it was because of mood symptoms irritability.  Affecting  relationship  Wife mentioned he is doing better compared to 1 year ago and he does do better regarding irritability when he does have some better hours of sleep.  There is no psychotic symptoms there is no clear manic symptoms that he gets out of control happy 4 days belligerent or doing risky things There are days when he gets hyped up increased energy and wants to finish projects  Medication tried in the past trazodone which caused a lot of hangover and drowsiness.  Zoloft but he did not continue for long.  No mood stabilizer has been tried  Currently is not using any drugs or alcohol. Aggravating F; shift work  PTSD: denies trauma or related symptoms  Past Psychiatric History: depression, mood symptoms. isnomnia  Previous Psychotropic Medications: Yes   Substance Abuse History in the last 12 months:  Yes.    Consequences of Substance Abuse: Medical Consequences:  fatigue  Past Medical History:  Past Medical History:  Diagnosis Date  . ANKLE PAIN, RIGHT 09/04/2010   Qualifier: Diagnosis of  By: Orson AloeHenderson MD, Tinnie GensJeffrey    . Smoker 09/04/2012    Past Surgical History:  Procedure Laterality Date  . left hand  8-08  . right knee bone spurs  6-00    Family Psychiatric History: mom:L bipolar  Family History:  Family History  Problem Relation Age of Onset  . Hypertension Father   . Heart disease Father   .  Heart attack Father   . Cancer Other        lung  . Diabetes Other     Social History:   Social History   Socioeconomic History  . Marital status: Married    Spouse name: Not on file  . Number of children: 3  . Years of education: Not on file  . Highest education level: Some college, no degree  Occupational History  . Not on file  Social Needs  . Financial resource strain: Not on file  . Food insecurity:    Worry: Not on file    Inability: Not on file  . Transportation needs:    Medical: Not on file    Non-medical: Not on file  Tobacco Use  . Smoking  status: Current Every Day Smoker    Packs/day: 0.50    Years: 12.00    Pack years: 6.00  . Smokeless tobacco: Never Used  Substance and Sexual Activity  . Alcohol use: No  . Drug use: No  . Sexual activity: Yes  Lifestyle  . Physical activity:    Days per week: Not on file    Minutes per session: Not on file  . Stress: Not on file  Relationships  . Social connections:    Talks on phone: Not on file    Gets together: Not on file    Attends religious service: Not on file    Active member of club or organization: Not on file    Attends meetings of clubs or organizations: Not on file    Relationship status: Married  Other Topics Concern  . Not on file  Social History Narrative  . Not on file    Additional Social History: Grew up with parents younger sister was 54 years younger.  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  No active zone in combat Married 11 years. Has 3 kids  Allergies:  No Known Allergies  Metabolic Disorder Labs: No results found for: HGBA1C, MPG No results found for: PROLACTIN Lab Results  Component Value Date   CHOL 158 11/05/2012   TRIG 88 11/05/2012   HDL 35 (L) 11/05/2012   CHOLHDL 4.5 11/05/2012   VLDL 18 11/05/2012   LDLCALC 105 (H) 11/05/2012     Current Medications: Current Outpatient Medications  Medication Sig Dispense Refill  . buPROPion (WELLBUTRIN SR) 100 MG 12 hr tablet Take 1 tablet (100 mg total) by mouth daily. Take during the wake time  Or work time. Not at sleep time 30 tablet 0  . ketoconazole (NIZORAL) 2 % cream Apply 1 application topically daily. 15 g 0  . zolpidem (AMBIEN) 10 MG tablet Take 1 tablet (10 mg total) by mouth at bedtime as needed for sleep. For sleep time 30 tablet 0   No current facility-administered medications for this visit.      Neurologic: Headache: No Seizure: No Paresthesias:No  Musculoskeletal: Strength & Muscle Tone: within normal limits Gait & Station: normal Patient leans: no lean  Psychiatric Specialty Exam: Review of Systems  Constitutional: Positive for malaise/fatigue.  Cardiovascular: Negative for chest pain.  Neurological: Negative for tremors.  Psychiatric/Behavioral: The patient is nervous/anxious and has insomnia.     Blood pressure 128/76, pulse 93, height 5\' 8"  (1.727 m), weight 199 lb (90.3 kg), SpO2 96 %.Body mass index is 30.26 kg/m.  General Appearance: Casual  Eye Contact:  Fair  Speech:  Slow  Volume:  Normal  Mood:  subdued  Affect:  Congruent  Thought Process:  Goal Directed  Orientation:  Full (Time, Place, and Person)  Thought Content:  Rumination  Suicidal Thoughts:  No  Homicidal Thoughts:  No  Memory:  Immediate;   Fair Recent;   Fair  Judgement:  Fair  Insight:  Fair  Psychomotor Activity:  Normal  Concentration:  Concentration: Fair and Attention Span: Fair  Recall:  [employer] of Knowledge:Fair  Language: Fair  Akathisia:  No  Handed:  Right  AIMS (if indicated):    Assets:  Desire for Improvement  ADL's:  Intact  Cognition: WNL  Sleep:  Poor and irregular    Treatment Plan Summary: Medication management and Plan as follows  Mood disorder Not otherwise specified: no clear manic history, sleep and irregular work hours may be contributing to mood symptoms . Wife confer that as well. Cannot change jobs or shift Reviewed sleep hygiene Has had anxiety and depressive days. Will add wellbutrin during the day to help fatigue and depression  GAD: will work on therapy to work on Pharmacologist, worries .  Shift work disorder; reviewed sleep hygine, avoid coffee and nictoine near sleep time or before  Will add ambien 10mg  during sleep time or for daytimg after work when he has to sleep Reviewed meds, side effects  May consider mood stabilizer if mood  remains irritable after better in sleep   More than 50% time spent in counseling and coordination of care including patient education reviewed side effects and concerns were addressed next  Follow-up in 4 weeks or earlier if needed   Thresa Ross, MD 9/24/201911:42 AM

## 2018-10-09 ENCOUNTER — Encounter (HOSPITAL_COMMUNITY): Payer: Self-pay | Admitting: Psychiatry

## 2018-10-09 ENCOUNTER — Ambulatory Visit (INDEPENDENT_AMBULATORY_CARE_PROVIDER_SITE_OTHER): Payer: 59 | Admitting: Psychiatry

## 2018-10-09 VITALS — BP 128/80 | HR 91 | Ht 68.0 in | Wt 199.0 lb

## 2018-10-09 DIAGNOSIS — F411 Generalized anxiety disorder: Secondary | ICD-10-CM

## 2018-10-09 DIAGNOSIS — G4726 Circadian rhythm sleep disorder, shift work type: Secondary | ICD-10-CM | POA: Diagnosis not present

## 2018-10-09 DIAGNOSIS — F0630 Mood disorder due to known physiological condition, unspecified: Secondary | ICD-10-CM

## 2018-10-09 MED ORDER — BUPROPION HCL ER (SR) 100 MG PO TB12: 100.0000 mg | ORAL_TABLET | Freq: Every day | ORAL | 1 refills | Status: DC

## 2018-10-09 NOTE — Progress Notes (Signed)
[facility] Outpatient Follow up visit   Patient Identification: Raymond Stewart MRN:  [id number] Date of Evaluation:  10/09/2018 Referral Source: Dr. Linford Arnold Chief Complaint:   Chief Complaint    Follow-up; Other     Visit Diagnosis:    ICD-10-CM   1. Mood disorder in conditions classified elsewhere F06.30   2. GAD (generalized anxiety disorder) F41.1   3. 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  Initially Referred for management of mood symptoms irritability anxiety worries and poor sleep related to his shift work  Patient works at [employer] shift work of 12 hours which has been irregular, have tried different meds. Mood would get edgy, daytime fatigue and irritability  Last visit we added ambien for his sleep time and wellbutrin for his wake time and depression He feels better. Avoiding excessive coffee in. During the work time.   Modifying factors  Wife, kids   Affecting relationship was irritability which is better Shift work was supposed to change to daytime but is not for now. He is ok with 2nd shift    Medication tried in the past trazodone which caused a lot of hangover and drowsiness.  Zoloft but he did not continue for long.  No mood stabilizer has been tried  Currently is not using any drugs or alcohol. Aggravating F; shift work  PTSD: denies trauma or related symptoms  Past Psychiatric History: depression, mood symptoms. isnomnia  Previous Psychotropic Medications: Yes   Substance Abuse History in the last 12 months:  Yes.    Consequences of Substance Abuse: Medical Consequences:  fatigue  Past Medical History:  Past Medical History:  Diagnosis Date  . ANKLE PAIN, RIGHT 09/04/2010   Qualifier: Diagnosis of  By: Orson Aloe MD, Tinnie Gens    . Smoker 09/04/2012    Past Surgical History:  Procedure Laterality Date  . left hand  8-08  . right knee bone spurs  6-00    Family Psychiatric  History: mom:L bipolar  Family History:  Family History  Problem Relation Age of Onset  . Hypertension Father   . Heart disease Father   . Heart attack Father   . Cancer Other        lung  . Diabetes Other     Social History:   Social History   Socioeconomic History  . Marital status: Married    Spouse name: Not on file  . Number of children: 3  . Years of education: Not on file  . Highest education level: Some college, no degree  Occupational History  . Not on file  Social Needs  . Financial resource strain: Not on file  . Food insecurity:    Worry: Not on file    Inability: Not on file  . Transportation needs:    Medical: Not on file    Non-medical: Not on file  Tobacco Use  . Smoking status: Current Every Day Smoker    Packs/day: 0.50    Years: 12.00    Pack years: 6.00  . Smokeless tobacco: Never Used  Substance and Sexual Activity  . Alcohol use: No  . Drug use: No  . Sexual activity: Yes  Lifestyle  . Physical activity:    Days per week: Not on file    Minutes per session: Not on file  . Stress: Not on file  Relationships  . Social connections:    Talks on phone: Not on file  Gets together: Not on file    Attends religious service: Not on file    Active member of club or organization: Not on file    Attends meetings of clubs or organizations: Not on file    Relationship status: Married  Other Topics Concern  . Not on file  Social History Narrative  . Not on file     Allergies:  No Known Allergies  Metabolic Disorder Labs: No results found for: HGBA1C, MPG No results found for: PROLACTIN Lab Results  Component Value Date   CHOL 158 11/05/2012   TRIG 88 11/05/2012   HDL 35 (L) 11/05/2012   CHOLHDL 4.5 11/05/2012   VLDL 18 11/05/2012   LDLCALC 105 (H) 11/05/2012     Current Medications: Current Outpatient Medications  Medication Sig Dispense Refill  . buPROPion (WELLBUTRIN SR) 100 MG 12 hr tablet Take 1 tablet (100 mg total) by  mouth daily. Take during the wake time  Or work time. Not at sleep time 30 tablet 1  . ketoconazole (NIZORAL) 2 % cream Apply 1 application topically daily. 15 g 0  . zolpidem (AMBIEN) 10 MG tablet Take 1 tablet (10 mg total) by mouth at bedtime as needed for sleep. For sleep time 30 tablet 0   No current facility-administered medications for this visit.       Psychiatric Specialty Exam: Review of Systems  Constitutional: Positive for malaise/fatigue.  Cardiovascular: Negative for chest pain and palpitations.  Neurological: Negative for tremors.    Blood pressure 128/80, pulse 91, height 5\' 8"  (1.727 m), weight 199 lb (90.3 kg).Body mass index is 30.26 kg/m.  General Appearance: Casual  Eye Contact:  Fair  Speech:  Slow  Volume:  Normal  Mood:  better  Affect:  Congruent  Thought Process:  Goal Directed  Orientation:  Full (Time, Place, and Person)  Thought Content:  Rumination  Suicidal Thoughts:  No  Homicidal Thoughts:  No  Memory:  Immediate;   Fair Recent;   Fair  Judgement:  Fair  Insight:  Fair  Psychomotor Activity:  Normal  Concentration:  Concentration: Fair and Attention Span: Fair  Recall:  [employer] of Knowledge:Fair  Language: Fair  Akathisia:  No  Handed:  Right  AIMS (if indicated):    Assets:  Desire for Improvement  ADL's:  Intact  Cognition: WNL  Sleep:  Poor and irregular    Treatment Plan Summary: Medication management and Plan as follows  Mood disorder Not otherwise specified:  Mood is better. Continue wellbutrin   GAD:  Better. Sleep improved has helped Shift work disorder; better. Avoid excessive coffee and reviewed sleep hygiene  Continue ambien for sleep   Reviewed meds, side effects  May consider mood stabilizer but its better with sleep improved.  More than 50% time spent in counseling and coordination of care including patient education reviewed side effects and concerns were addressed next  Follow-up in 4 weeks or earlier if  needed   Thresa Ross, MD 10/24/201910:25 AM

## 2018-10-28 ENCOUNTER — Ambulatory Visit: Payer: 59 | Admitting: Family Medicine

## 2018-10-28 ENCOUNTER — Encounter: Payer: Self-pay | Admitting: Family Medicine

## 2018-11-12 ENCOUNTER — Other Ambulatory Visit: Payer: Self-pay

## 2018-11-12 ENCOUNTER — Ambulatory Visit (INDEPENDENT_AMBULATORY_CARE_PROVIDER_SITE_OTHER): Payer: 59 | Admitting: Psychiatry

## 2018-11-12 ENCOUNTER — Encounter (HOSPITAL_COMMUNITY): Payer: Self-pay | Admitting: Psychiatry

## 2018-11-12 VITALS — BP 138/88 | HR 86 | Ht 68.0 in | Wt 200.0 lb

## 2018-11-12 DIAGNOSIS — G4726 Circadian rhythm sleep disorder, shift work type: Secondary | ICD-10-CM

## 2018-11-12 DIAGNOSIS — F411 Generalized anxiety disorder: Secondary | ICD-10-CM | POA: Diagnosis not present

## 2018-11-12 DIAGNOSIS — F0630 Mood disorder due to known physiological condition, unspecified: Secondary | ICD-10-CM | POA: Diagnosis not present

## 2018-11-12 MED ORDER — LAMOTRIGINE 25 MG PO TABS: 25.0000 mg | ORAL_TABLET | Freq: Every day | ORAL | 0 refills | Status: DC

## 2018-11-12 MED ORDER — ZOLPIDEM TARTRATE 10 MG PO TABS: 10.0000 mg | ORAL_TABLET | Freq: Every evening | ORAL | 0 refills | Status: DC | PRN

## 2018-11-12 NOTE — Progress Notes (Signed)
[facility] Outpatient Follow up visit   Patient Identification: Raymond Stewart MRN:  [id number] Date of Evaluation:  11/12/2018 Referral Source: Dr. Linford Arnold Chief Complaint:   Chief Complaint    Follow-up; Other     Visit Diagnosis:    ICD-10-CM   1. Mood disorder in conditions classified elsewhere F06.30   2. GAD (generalized anxiety disorder) F41.1   3. 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  Initially Referred for management of mood symptoms irritability anxiety worries and poor sleep related to his shift work  Patient works at [employer] shift work of 12 hours which has been irregular, have tried different meds. Mood would get edgy, daytime fatigue and irritability  Sleep and depression is better. He feels he over analyze, wants to be perfect and expect the same from kids Gets angry or frutsrtated if things are not done or gets harsh on himself   Avoiding excessive coffee in. During the work time.   Modifying factors  Wife, kids   Affecting relationship was irritability which is better Remus Loffler is helping sleep. He feels he wants to keep busy and gets frustrated over things if not done the right way Currently is not using any drugs or alcohol. Aggravating F; shift work  PTSD: denies trauma or related symptoms  Past Psychiatric History: depression, mood symptoms. isnomnia  Previous Psychotropic Medications: Yes   Substance Abuse History in the last 12 months:  Yes.    Consequences of Substance Abuse: Medical Consequences:  fatigue  Past Medical History:  Past Medical History:  Diagnosis Date  . ANKLE PAIN, RIGHT 09/04/2010   Qualifier: Diagnosis of  By: Orson Aloe MD, Tinnie Gens    . Smoker 09/04/2012    Past Surgical History:  Procedure Laterality Date  . left hand  8-08  . right knee bone spurs  6-00    Family Psychiatric History: mom:L bipolar  Family History:  Family History  Problem  Relation Age of Onset  . Hypertension Father   . Heart disease Father   . Heart attack Father   . Cancer Other        lung  . Diabetes Other     Social History:   Social History   Socioeconomic History  . Marital status: Married    Spouse name: Not on file  . Number of children: 3  . Years of education: Not on file  . Highest education level: Some college, no degree  Occupational History  . Not on file  Social Needs  . Financial resource strain: Not on file  . Food insecurity:    Worry: Not on file    Inability: Not on file  . Transportation needs:    Medical: Not on file    Non-medical: Not on file  Tobacco Use  . Smoking status: Current Every Day Smoker    Packs/day: 0.50    Years: 12.00    Pack years: 6.00  . Smokeless tobacco: Never Used  Substance and Sexual Activity  . Alcohol use: No  . Drug use: No  . Sexual activity: Yes  Lifestyle  . Physical activity:    Days per week: Not on file    Minutes per session: Not on file  . Stress: Not on file  Relationships  . Social connections:    Talks on phone: Not on file    Gets together: Not on file    Attends religious service: Not  on file    Active member of club or organization: Not on file    Attends meetings of clubs or organizations: Not on file    Relationship status: Married  Other Topics Concern  . Not on file  Social History Narrative  . Not on file     Allergies:  No Known Allergies  Metabolic Disorder Labs: No results found for: HGBA1C, MPG No results found for: PROLACTIN Lab Results  Component Value Date   CHOL 158 11/05/2012   TRIG 88 11/05/2012   HDL 35 (L) 11/05/2012   CHOLHDL 4.5 11/05/2012   VLDL 18 11/05/2012   LDLCALC 105 (H) 11/05/2012     Current Medications: Current Outpatient Medications  Medication Sig Dispense Refill  . buPROPion (WELLBUTRIN SR) 100 MG 12 hr tablet Take 1 tablet (100 mg total) by mouth daily. Take during the wake time  Or work time. Not at sleep time  30 tablet 1  . ketoconazole (NIZORAL) 2 % cream Apply 1 application topically daily. 15 g 0  . zolpidem (AMBIEN) 10 MG tablet Take 1 tablet (10 mg total) by mouth at bedtime as needed for sleep. For sleep time 30 tablet 0  . lamoTRIgine (LAMICTAL) 25 MG tablet Take 1 tablet (25 mg total) by mouth daily. Take one tablet daily for a week and then start taking 2 tablets. 60 tablet 0   No current facility-administered medications for this visit.       Psychiatric Specialty Exam: Review of Systems  Cardiovascular: Negative for chest pain and palpitations.  Skin: Negative for rash.  Neurological: Negative for tremors.    Blood pressure 138/88, pulse 86, height 5\' 8"  (1.727 m), weight 200 lb (90.7 kg).Body mass index is 30.41 kg/m.  General Appearance: Casual  Eye Contact:  Fair  Speech:  Slow  Volume:  Normal  Mood:  fair  Affect:  Congruent  Thought Process:  Goal Directed  Orientation:  Full (Time, Place, and Person)  Thought Content:  Rumination  Suicidal Thoughts:  No  Homicidal Thoughts:  No  Memory:  Immediate;   Fair Recent;   Fair  Judgement:  Fair  Insight:  Fair  Psychomotor Activity:  Normal  Concentration:  Concentration: Fair and Attention Span: Fair  Recall:  [employer]  Fund of Knowledge:Fair  Language: Fair  Akathisia:  No  Handed:  Right  AIMS (if indicated):    Assets:  Desire for Improvement  ADL's:  Intact  Cognition: WNL  Sleep:  Poor and irregular    Treatment Plan Summary: Medication management and Plan as follows  Mood disorder Not otherwise specified:  Not depressed but over analyze, over generalize, refer to therapy but doesn't want to. Will add mood stabilizer lamictal increase to 50mg  in one week. Rash explained   GAD: fluctuates. Continue to work on distraction and balancing time. Does not want to be on SSri, Shift work disorder; better. Avoid excessive coffee and reviewed sleep hygiene  Sleep better on ambien   Reviewed meds, side  effects   Follow-up in 4 weeks or earlier if needed. Would benefit from therapy but declines   Thresa RossNadeem Micholas Drumwright, MD 11/27/20198:58 AM

## 2018-12-23 ENCOUNTER — Ambulatory Visit (HOSPITAL_COMMUNITY): Payer: 59 | Admitting: Psychiatry

## 2018-12-31 ENCOUNTER — Ambulatory Visit (HOSPITAL_COMMUNITY): Payer: 59 | Admitting: Psychiatry

## 2019-03-11 ENCOUNTER — Encounter: Payer: Self-pay | Admitting: Family Medicine

## 2019-04-20 ENCOUNTER — Encounter: Payer: Self-pay | Admitting: Family Medicine

## 2019-04-20 NOTE — Telephone Encounter (Signed)
Raymond Stewart complains of dizziness, sore throat, slight cough and nasal congestion. He believes most of his symptoms come from allergies. No fever or shortness of breath. He is scheduled tomorrow with Dr Benjamin Stain.

## 2019-04-20 NOTE — Telephone Encounter (Signed)
Left a message for patient to call back to schedule an appointment.

## 2019-04-21 ENCOUNTER — Ambulatory Visit (INDEPENDENT_AMBULATORY_CARE_PROVIDER_SITE_OTHER): Payer: 59 | Admitting: Sports Medicine

## 2019-04-21 ENCOUNTER — Encounter: Payer: Self-pay | Admitting: Sports Medicine

## 2019-04-21 DIAGNOSIS — I951 Orthostatic hypotension: Secondary | ICD-10-CM | POA: Diagnosis not present

## 2019-04-21 DIAGNOSIS — H918X1 Other specified hearing loss, right ear: Secondary | ICD-10-CM | POA: Diagnosis not present

## 2019-04-21 DIAGNOSIS — H6123 Impacted cerumen, bilateral: Secondary | ICD-10-CM

## 2019-04-21 NOTE — Assessment & Plan Note (Signed)
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 This is not bothering him enough to consider ENT referral or hearing aid. I did remove cerumen from both external canals with instrumentation.

## 2019-04-21 NOTE — Assessment & Plan Note (Signed)
No specific precipitating factors. Occurred once at work when going from sitting to standing but has not occurred since. This is not consistent with COVID-19, his employer does need a note stating this. He did have some weakness in the legs, we are going to check some labs including CBC, CMP, TSH, CK levels. He may return to work without restrictions. I have asked him to substitute some of his Dr. Reino Kent during the day with Gatorade.

## 2019-04-21 NOTE — Progress Notes (Signed)
Subjective:    CC: Dizziness  HPI: This is a pleasant, healthy 38 year old male, several days ago he was at work, when he went to go from squatting to standing he felt dizzy, his legs felt weak, he felt a little bit flushed, symptoms resolved and then never recurred.  He denied any chest pain, shortness of breath, palpitations, he typically drinks water and a lot of Dr. Reino Kent.  Nothing was out of the ordinary in the days preceding or postceeding this episode.  Symptoms were mild and have resolved.  In addition he was complaining of some hearing loss in the right ear, sometimes it sounds as though things are muffled.  It is not bothering him enough to consider a hearing aid or referral to ENT/audiology.  I reviewed the past medical history, family history, social history, surgical history, and allergies today and no changes were needed.  Please see the problem list section below in epic for further details.  Past Medical History: Past Medical History:  Diagnosis Date  . ANKLE PAIN, RIGHT 09/04/2010   Qualifier: Diagnosis of  By: Orson Aloe MD, Tinnie Gens    . Smoker 09/04/2012   Past Surgical History: Past Surgical History:  Procedure Laterality Date  . left hand  8-08  . right knee bone spurs  6-00   Social History: Social History   Socioeconomic History  . Marital status: Married    Spouse name: Not on file  . Number of children: 3  . Years of education: Not on file  . Highest education level: Some college, no degree  Occupational History  . Not on file  Social Needs  . Financial resource strain: Not on file  . Food insecurity:    Worry: Not on file    Inability: Not on file  . Transportation needs:    Medical: Not on file    Non-medical: Not on file  Tobacco Use  . Smoking status: Current Every Day Smoker    Packs/day: 0.50    Years: 12.00    Pack years: 6.00  . Smokeless tobacco: Never Used  Substance and Sexual Activity  . Alcohol use: No  . Drug use: No  . Sexual  activity: Yes  Lifestyle  . Physical activity:    Days per week: Not on file    Minutes per session: Not on file  . Stress: Not on file  Relationships  . Social connections:    Talks on phone: Not on file    Gets together: Not on file    Attends religious service: Not on file    Active member of club or organization: Not on file    Attends meetings of clubs or organizations: Not on file    Relationship status: Married  Other Topics Concern  . Not on file  Social History Narrative  . Not on file   Family History: Family History  Problem Relation Age of Onset  . Hypertension Father   . Heart disease Father   . Heart attack Father   . Cancer Other        lung  . Diabetes Other    Allergies: No Known Allergies Medications: See med rec.  Review of Systems: No fevers, chills, night sweats, weight loss, chest pain, or shortness of breath.   Objective:    General: Well Developed, well nourished, and in no acute distress.  Neuro: Alert and oriented x3, extra-ocular muscles intact, sensation grossly intact.  HEENT: Normocephalic, atraumatic, pupils equal round reactive to light, neck supple,  no masses, no lymphadenopathy, thyroid nonpalpable.  Oropharynx, nasopharynx unremarkable, bilateral cerumen impactions, eardrums look normal bilaterally. Skin: Warm and dry, no rashes. Cardiac: Regular rate and rhythm, no murmurs rubs or gallops, no lower extremity edema.  Respiratory: Clear to auscultation bilaterally. Not using accessory muscles, speaking in full sentences.  Indication: Cerumen impaction of the left and right ear(s) Medical necessity statement: On physical examination, cerumen impairs clinically significant portions of the external auditory canal, and tympanic membrane. Noted obstructive, copious cerumen that cannot be removed without magnification and instrumentations requiring physician skills Consent: Discussed benefits and risks of procedure and verbal consent obtained  Procedure: Patient was prepped for the procedure. Utilized an otoscope to assess and take note of the ear canal, the tympanic membrane, and the presence, amount, and placement of the cerumen. GSoft plastic curette was utilized to remove cerumen.  Post procedure examination: shows cerumen was completely removed. Patient tolerated procedure well. The patient is made aware that they may experience temporary vertigo, temporary hearing loss, and temporary discomfort. If these symptom last for more than 24 hours to call the clinic or proceed to the ED.  Impression and Recommendations:    Orthostatic hypotension No specific precipitating factors. Occurred once at work when going from sitting to standing but has not occurred since. This is not consistent with COVID-19, his employer does need a note stating this. He did have some weakness in the legs, we are going to check some labs including CBC, CMP, TSH, CK levels. He may return to work without restrictions. I have asked him to substitute some of his Dr. Reino KentPepper during the day with Gatorade.  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 This is not bothering him enough to consider ENT referral or hearing aid. I did remove cerumen from both external canals with instrumentation.   ___________________________________________ Ihor Austinhomas J. Benjamin Stainhekkekandam, M.D., ABFM., CAQSM. Primary Care and Sports Medicine [facility]  Adjunct Professor of Family Medicine  [employer]

## 2022-04-08 ENCOUNTER — Other Ambulatory Visit: Payer: Self-pay

## 2022-04-08 ENCOUNTER — Emergency Department
Admission: EM | Admit: 2022-04-08 | Discharge: 2022-04-08 | Disposition: A | Discharge status: Home / Self Care | Payer: 59 | Source: Home / Self Care

## 2022-04-08 DIAGNOSIS — K122 Cellulitis and abscess of mouth: Secondary | ICD-10-CM

## 2022-04-08 DIAGNOSIS — J029 Acute pharyngitis, unspecified: Secondary | ICD-10-CM

## 2022-04-08 LAB — POCT RAPID STREP A (OFFICE): Rapid Strep A Screen: NEGATIVE

## 2022-04-08 MED ORDER — AMOXICILLIN-POT CLAVULANATE 875-125 MG PO TABS
1.0000 | ORAL_TABLET | Freq: Two times a day (BID) | ORAL | 0 refills | Status: DC
Start: 2022-04-08 — End: 2024-01-07

## 2022-04-08 MED ORDER — PREDNISONE 20 MG PO TABS: ORAL_TABLET | ORAL | 0 refills | Status: DC

## 2022-04-08 NOTE — ED Triage Notes (Signed)
Pt presents with sore throat x 5 days.

## 2022-04-08 NOTE — Discharge Instructions (Addendum)
Advised patient to take medication as directed with food to completion.  Instructed patient to take prednisone with first dose of Augmentin for the next 5 of 7 days.  Encouraged patient to increase daily water intake while taking these medications. ?

## 2022-04-08 NOTE — ED Provider Notes (Signed)
?[facility] ? ? ? ?CSN: [id number] ?Arrival date & time: 04/08/22  9381 ? ? ?  ? ?History   ?Chief Complaint ?Chief Complaint  ?Patient presents with  ? Sore Throat  ? ? ?HPI ?Raymond Stewart is a 41 y.o. male.  ? ?HPI 41 year old male presents with sore throat for 5 days.  Patient is current everyday cigarette smoker. ? ?Past Medical History:  ?Diagnosis Date  ? ANKLE PAIN, RIGHT 09/04/2010  ? Qualifier: Diagnosis of  By: Orson Aloe MD, Tinnie Gens    ? Smoker 09/04/2012  ? ? ?Patient Active Problem List  ? Diagnosis Date Noted  ? Orthostatic hypotension 04/21/2019  ? Olecranon bursitis, right elbow 09/28/2016  ? Olecranon bursitis of left elbow 06/26/2016  ? Subacromial bursitis 01/09/2016  ? 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 GAD (generalized anxiety disorder) 08/03/2014  ? Primary osteoarthritis of right hip 09/08/2013  ? Scrotal pain 05/19/2013  ? Smoker 09/04/2012  ? ? ?Past Surgical History:  ?Procedure Laterality Date  ? JOINT REPLACEMENT    ? left hand  07/18/2007  ? right knee bone spurs  05/18/1999  ? ? ? ? ? ?Home Medications   ? ?Prior to Admission medications   ?Medication Sig Start Date End Date Taking? Authorizing Provider  ?amoxicillin-clavulanate (AUGMENTIN) 875-125 MG tablet Take 1 tablet by mouth 2 (two) times daily. 04/08/22  Yes Trevor Iha, FNP  ?amphetamine-dextroamphetamine (ADDERALL) 10 MG tablet Take 10 mg by mouth in the morning, at noon, and at bedtime.   Yes [provider]  ?predniSONE (DELTASONE) 20 MG tablet Take 3 tabs PO daily x 5 days. 04/08/22  Yes Trevor Iha, FNP  ? ? ?Family History ?Family History  ?Problem Relation Age of Onset  ? Hypertension Father   ? Heart disease Father   ? Heart attack Father   ? Cancer Other   ?     lung  ? Diabetes Other   ? ? ?Social History ?Social History  ? ?Tobacco Use  ? Smoking status: Every Day  ?  Packs/day: 0.50  ?  Years: 12.00  ?  Pack years: 6.00  ?  Types: Cigarettes  ? Smokeless tobacco: Never  ?Vaping Use  ?  Vaping Use: Former  ?Substance Use Topics  ? Alcohol use: No  ? Drug use: No  ? ? ? ?Allergies   ?Patient has no known allergies. ? ? ?Review of Systems ?Review of Systems  ?HENT:  Positive for sore throat.   ?All other systems reviewed and are negative. ? ? ?Physical Exam ?Triage Vital Signs ?ED Triage Vitals  ?Enc Vitals Group  ?   BP 04/08/22 0906 136/86  ?   Pulse Rate 04/08/22 0906 98  ?   Resp 04/08/22 0906 16  ?   Temp 04/08/22 0906 98.3 ?F (36.8 ?C)  ?   Temp Source 04/08/22 0906 Oral  ?   SpO2 04/08/22 0906 98 %  ?   Weight --   ?   Height --   ?   Head Circumference --   ?   Peak Flow --   ?   Pain Score 04/08/22 0908 7  ?   Pain Loc --   ?   Pain Edu? --   ?   Excl. in GC? --   ? ?No data found. ? ?Updated Vital Signs ?BP 136/86 (BP Location: Right Arm)   Pulse 98   Temp 98.3 ?F (36.8 ?C) (Oral)   Resp 16  SpO2 98%  ? ? ?Physical Exam ?Vitals reviewed.  ?Constitutional:   ?   Appearance: Normal appearance. He is well-developed and normal weight. He is ill-appearing.  ?HENT:  ?   Head: Normocephalic and atraumatic.  ?   Right Ear: Tympanic membrane, ear canal and external ear normal.  ?   Left Ear: Tympanic membrane, ear canal and external ear normal.  ?   Mouth/Throat:  ?   Mouth: Mucous membranes are moist.  ?   Pharynx: Uvula midline. Pharyngeal swelling, posterior oropharyngeal erythema and uvula swelling present.  ?Eyes:  ?   Extraocular Movements: Extraocular movements intact.  ?   Conjunctiva/sclera: Conjunctivae normal.  ?   Pupils: Pupils are equal, round, and reactive to light.  ?Cardiovascular:  ?   Rate and Rhythm: Normal rate and regular rhythm.  ?   Heart sounds: Normal heart sounds. No murmur heard. ?Pulmonary:  ?   Effort: Pulmonary effort is normal.  ?   Breath sounds: Normal breath sounds. No wheezing, rhonchi or rales.  ?Musculoskeletal:  ?   Cervical back: Normal range of motion and neck supple. Tenderness present.  ?Lymphadenopathy:  ?   Cervical: Cervical adenopathy present.   ?Skin: ?   General: Skin is warm and dry.  ?Neurological:  ?   General: No focal deficit present.  ?   Mental Status: He is alert and oriented to person, place, and time.  ? ? ? ?UC Treatments / Results  ?Labs ?(all labs ordered are listed, but only abnormal results are displayed) ?Labs Reviewed  ?POCT RAPID STREP A (OFFICE) - Normal  ?CULTURE, GROUP A STREP  ? ? ?EKG ? ? ?Radiology ?No results found. ? ?Procedures ?Procedures (including critical care time) ? ?Medications Ordered in UC ?Medications - No data to display ? ?Initial Impression / Assessment and Plan / UC Course  ?I have reviewed the triage vital signs and the nursing notes. ? ?Pertinent labs & imaging results that were available during my care of the patient were reviewed by me and considered in my medical decision making (see chart for details). ? ?  ? ?MDM: 1.  Sore throat-rapid strep negative; 2.  Uvulitis-Rx'd Augmentin and prednisone. Advised patient to take medication as directed with food to completion.  Instructed patient to take prednisone with first dose of Augmentin for the next 5 of 7 days.  Encouraged patient to increase daily water intake while taking these medications.  Work note provided to patient per request.  Patient discharged home, hemodynamically stable. ?Final Clinical Impressions(s) / UC Diagnoses  ? ?Final diagnoses:  ?Sore throat  ?Uvulitis  ? ? ? ?Discharge Instructions   ? ?  ?Advised patient to take medication as directed with food to completion.  Instructed patient to take prednisone with first dose of Augmentin for the next 5 of 7 days.  Encouraged patient to increase daily water intake while taking these medications. ? ? ? ? ?ED Prescriptions   ? ? Medication Sig Dispense Auth. Provider  ? amoxicillin-clavulanate (AUGMENTIN) 875-125 MG tablet Take 1 tablet by mouth 2 (two) times daily. 14 tablet Trevor Iha, FNP  ? predniSONE (DELTASONE) 20 MG tablet Take 3 tabs PO daily x 5 days. 15 tablet Trevor Iha, FNP  ? ?   ? ?PDMP not reviewed this encounter. ?  ?Trevor Iha, FNP ?04/08/22 0946 ? ?

## 2022-04-13 LAB — CULTURE, GROUP A STREP

## 2023-09-23 ENCOUNTER — Encounter (HOSPITAL_BASED_OUTPATIENT_CLINIC_OR_DEPARTMENT_OTHER): Payer: Self-pay | Admitting: Emergency Medicine

## 2023-09-23 ENCOUNTER — Emergency Department (HOSPITAL_BASED_OUTPATIENT_CLINIC_OR_DEPARTMENT_OTHER)
Admission: EM | Admit: 2023-09-23 | Discharge: 2023-09-23 | Disposition: A | Discharge status: Home / Self Care | Payer: 59 | Attending: Emergency Medicine | Admitting: Emergency Medicine

## 2023-09-23 ENCOUNTER — Emergency Department (HOSPITAL_BASED_OUTPATIENT_CLINIC_OR_DEPARTMENT_OTHER): Payer: 59 | Admitting: Radiology

## 2023-09-23 ENCOUNTER — Other Ambulatory Visit (HOSPITAL_BASED_OUTPATIENT_CLINIC_OR_DEPARTMENT_OTHER): Payer: Self-pay

## 2023-09-23 ENCOUNTER — Emergency Department (HOSPITAL_BASED_OUTPATIENT_CLINIC_OR_DEPARTMENT_OTHER): Payer: 59

## 2023-09-23 ENCOUNTER — Other Ambulatory Visit: Payer: Self-pay

## 2023-09-23 DIAGNOSIS — R197 Diarrhea, unspecified: Secondary | ICD-10-CM | POA: Insufficient documentation

## 2023-09-23 DIAGNOSIS — R079 Chest pain, unspecified: Secondary | ICD-10-CM | POA: Diagnosis present

## 2023-09-23 DIAGNOSIS — R1031 Right lower quadrant pain: Secondary | ICD-10-CM | POA: Insufficient documentation

## 2023-09-23 DIAGNOSIS — R0602 Shortness of breath: Secondary | ICD-10-CM | POA: Diagnosis not present

## 2023-09-23 LAB — CBC
HCT: 47.8 % (ref 39.0–52.0)
Hemoglobin: 16.7 g/dL (ref 13.0–17.0)
MCH: 30.9 pg (ref 26.0–34.0)
MCHC: 34.9 g/dL (ref 30.0–36.0)
MCV: 88.5 fL (ref 80.0–100.0)
Platelets: 217 10*3/uL (ref 150–400)
RBC: 5.4 MIL/uL (ref 4.22–5.81)
RDW: 13 % (ref 11.5–15.5)
WBC: 11 10*3/uL — ABNORMAL HIGH (ref 4.0–10.5)
nRBC: 0 % (ref 0.0–0.2)

## 2023-09-23 LAB — BASIC METABOLIC PANEL
Anion gap: 10 (ref 5–15)
BUN: 10 mg/dL (ref 6–20)
CO2: 26 mmol/L (ref 22–32)
Calcium: 9.2 mg/dL (ref 8.9–10.3)
Chloride: 102 mmol/L (ref 98–111)
Creatinine, Ser: 0.97 mg/dL (ref 0.61–1.24)
GFR, Estimated: >60 mL/min (ref >60)
Glucose, Bld: 106 mg/dL — ABNORMAL HIGH (ref 70–99)
Potassium: 3.8 mmol/L (ref 3.5–5.1)
Sodium: 138 mmol/L (ref 135–145)

## 2023-09-23 LAB — TROPONIN I (HIGH SENSITIVITY)
Troponin I (High Sensitivity): <2 ng/L (ref <18)
Troponin I (High Sensitivity): 2 ng/L (ref <18)

## 2023-09-23 MED ORDER — IOHEXOL 300 MG/ML  SOLN
100.0000 mL | Freq: Once | INTRAMUSCULAR | Status: AC | PRN
  Administered 2023-09-23: 100 mL via INTRAVENOUS

## 2023-09-23 NOTE — ED Provider Notes (Signed)
[facility] EMERGENCY DEPARTMENT AT [facility] Provider Note   CSN: [id number] Arrival date & time: 09/23/23  1040     History  Chief Complaint  Patient presents with   Chest Pain    Christon Parada [place] is a 42 y.o. male with significant past medical history of ADHD presents emergency department for evaluation of left anterior sharp chest pain that started at 10 00 this morning with associated shortness of breath.  He reports that he went on a cruise to [place] 3 days ago with a friend and had food and lemonade in [place].  Since then, he has been experiencing diarrhea, vomiting and dry heaving.  He reports that his friend who also ate the same food has been experiencing similar symptoms.  He denies fever, hematochezia, melena, hemoptysis, unilateral leg swelling, syncope.  Currently he reports that chest pain and shortness of breath resolved prior to arrival at ED.  He is currently complaining of right lower abdominal pain and had 1 episode of dry heaving in ED.  Chest Pain Associated symptoms: abdominal pain and vomiting   Associated symptoms: no cough, no dizziness, no fatigue, no fever, no headache, no nausea, no numbness, no palpitations, no shortness of breath and no weakness       Home Medications Prior to Admission medications   Medication Sig Start Date End Date Taking? Authorizing Provider  amoxicillin-clavulanate (AUGMENTIN) 875-125 MG tablet Take 1 tablet by mouth 2 (two) times daily. 04/08/22   Trevor Iha, FNP  amphetamine-dextroamphetamine (ADDERALL) 10 MG tablet Take 10 mg by mouth in the morning, at noon, and at bedtime.    [provider]  predniSONE (DELTASONE) 20 MG tablet Take 3 tabs PO daily x 5 days. 04/08/22   Trevor Iha, FNP      Allergies    Patient has no known allergies.    Review of Systems   Review of Systems  Constitutional:  Negative for chills, fatigue and fever.  Respiratory:  Negative for cough, chest tightness, shortness  of breath and wheezing.   Cardiovascular:  Positive for chest pain. Negative for palpitations.  Gastrointestinal:  Positive for abdominal pain, diarrhea and vomiting. Negative for constipation and nausea.  Neurological:  Negative for dizziness, seizures, weakness, light-headedness, numbness and headaches.    Physical Exam Updated Vital Signs BP 122/89   Pulse 79   Temp 98.3 F (36.8 C) (Oral)   Resp (!) 24   Ht 6\' 2"  (1.88 m)   Wt 98.9 kg   SpO2 100%   BMI 27.99 kg/m  Physical Exam Vitals and nursing note reviewed.  Constitutional:      General: He is not in acute distress.    Appearance: Normal appearance.  HENT:     Head: Normocephalic and atraumatic.  Eyes:     Conjunctiva/sclera: Conjunctivae normal.  Cardiovascular:     Rate and Rhythm: Normal rate.  Pulmonary:     Effort: Pulmonary effort is normal. No respiratory distress.     Breath sounds: No wheezing.  Chest:     Chest wall: No tenderness.  Abdominal:     General: There is no distension.     Palpations: Abdomen is soft.     Tenderness: There is abdominal tenderness (RLQ). There is no guarding or rebound.  Musculoskeletal:     Cervical back: Neck supple. No rigidity or tenderness.     Right lower leg: No edema.     Left lower leg: No edema.     Comments: No TTP  to LE bilaterally Homans' sign negative bilaterally  Lymphadenopathy:     Cervical: No cervical adenopathy.  Skin:    Coloration: Skin is not jaundiced or pale.  Neurological:     Mental Status: He is alert. Mental status is at baseline.     ED Results / Procedures / Treatments   Labs (all labs ordered are listed, but only abnormal results are displayed) Labs Reviewed  BASIC METABOLIC PANEL - Abnormal; Notable for the following components:      Result Value   Glucose, Bld 106 (*)    All other components within normal limits  CBC - Abnormal; Notable for the following components:   WBC 11.0 (*)    All other components within normal limits   TROPONIN I (HIGH SENSITIVITY)  TROPONIN I (HIGH SENSITIVITY)    EKG EKG Interpretation Date/Time:  Monday September 23 2023 10:58:28 EDT Ventricular Rate:  89 PR Interval:  160 QRS Duration:  100 QT Interval:  358 QTC Calculation: 436 R Axis:   79  Text Interpretation: Sinus rhythm RSR' in V1 or V2, right VCD or RVH no acute ischemic appearance. Confirmed by Arby Barrette [phone]) on 09/23/2023 11:12:15 AM  Radiology CT ABDOMEN PELVIS W CONTRAST  Result Date: 09/23/2023 CLINICAL DATA:  RLQ abdominal pain EXAM: CT ABDOMEN AND PELVIS WITH CONTRAST TECHNIQUE: Multidetector CT imaging of the abdomen and pelvis was performed using the standard protocol following bolus administration of intravenous contrast. RADIATION DOSE REDUCTION: This exam was performed according to the departmental dose-optimization program which includes automated exposure control, adjustment of the mA and/or kV according to patient size and/or use of iterative reconstruction technique. CONTRAST:  OMNIPAQUE IOHEXOL 300 MG/ML  SOLN COMPARISON:  Chest XR, 09/23/2023. FINDINGS: Lower chest: No acute abnormality. Hepatobiliary: Nonenlarged liver with smooth contour. 1.3 cm RIGHT hepatic lobe hypodensity, too small to adequately characterize though likely a small hemangioma. Additional sub-1 cm scattered hypodensities within liver are much smaller characterize. No gallstones, gallbladder wall thickening, or biliary dilatation. Pancreas: No pancreatic ductal dilatation or surrounding inflammatory changes. Spleen: Normal in size without focal abnormality. Adrenals/Urinary Tract: Adrenal glands are unremarkable. Incidental pelvic LEFT kidney. Kidneys are otherwise normal, without renal calculi, focal lesion, or hydronephrosis. Bladder is unremarkable. Stomach/Bowel: Stomach is within normal limits. Appendix appears normal. Nonobstructed small bowel. Nondilated colon. Focal mucosal thickening at the rectosigmoid junction, spanning ~4  cm, with question of mucosal lesion. See key image. No evidence of bowel distention, or inflammatory changes. Vascular/Lymphatic: No significant vascular findings are present. No enlarged abdominal or pelvic lymph nodes. Reproductive: Prostate is unremarkable. Other: No abdominal wall hernia or abnormality. No abdominopelvic ascites. Musculoskeletal: Hip arthroplasties, greater than expected in a patient of this age. No acute osseous findings. IMPRESSION: 1. No acute abdominopelvic process. 2. Mucosal thickening of the rectosigmoid junction, incompletely characterized and suspicious for a colonic lesion Referral for direct colonoscopic visualization is recommended. Electronically Signed   By: Roanna Banning M.D.   On: 09/23/2023 15:09   DG Chest 2 View  Result Date: 09/23/2023 CLINICAL DATA:  Chest pain. EXAM: CHEST - 2 VIEW COMPARISON:  07/14/2009. FINDINGS: Bilateral lung fields are clear. Bilateral costophrenic angles are clear. Normal cardio-mediastinal silhouette. No acute osseous abnormalities. The soft tissues are within normal limits. IMPRESSION: No active cardiopulmonary disease. Electronically Signed   By: Jules Schick M.D.   On: 09/23/2023 13:20    Procedures Procedures    Medications Ordered in ED Medications  iohexol (OMNIPAQUE) 300 MG/ML solution 100 mL (100 mLs  Intravenous Contrast Given 09/23/23 1324)    ED Course/ Medical Decision Making/ A&P                                 Medical Decision Making Amount and/or Complexity of Data Reviewed Labs: ordered. Radiology: ordered.  Risk Prescription drug management.   Patient presents to the ED for concern of sudden sharp left-sided chest pain with shortness of breath at 10 00, this involves an extensive number of treatment options, and is a complaint that carries with it a high risk of complications and morbidity.  The differential diagnosis includes ACS, MSK, dehydration, PE   Co morbidities that complicate the patient  evaluation  None   Additional history obtained:  Additional history obtained from Nursing and Outside Medical Records   External records from outside source obtained on evaluation   Lab Tests:  I Ordered, and personally interpreted labs.  The pertinent results include: Mild leukocytosis (11). Negative troponin x 2   Imaging Studies ordered:  I ordered imaging studies including chest x-ray and CT abdomen with contrast I independently visualized and interpreted imaging which showed no acute cardiopulmonary disease nor acute intra-abdominal pathology. Incidental finding of a suspicious colonic lesion and recommendation for a referral for direct colonoscopy visualization is found I agree with the radiologist interpretation   Cardiac Monitoring:  The patient was maintained on a cardiac monitor.  I personally viewed and interpreted the cardiac monitored which showed an underlying rhythm of: Normal sinus rhythm with no acute ischemia or STE   Medicines ordered and prescription drug management:  I have reviewed the patients home medicines and have made adjustments as needed   Test Considered:  D-dimer or CTPA however patient does not have current chest pain or shortness of breath.  He is PERC negative    Problem List / ED Course:  Chest pain Abdominal pain Diarrhea   Reevaluation:  After the interventions noted above, I reevaluated the patient and found that they have :stayed the same    Dispostion:  On evaluation, patient is resting comfortably in bed.  He is not ill-appearing.  Vital signs WNL.  See HPI.  Upon evaluation, patient does not have current chest pain or shortness of breath in ED.  EKG shows normal sinus rhythm with no ST or signs of ischemia.  Troponin neg x 2.  Chest ray is not significant for acute cardiopulmonary pathology.  He had 1 episode of dry heaving but has not been nauseous or had vomiting since.  He reports that he had significant diarrhea  on Thursday and Friday however has not had a bowel movement since.  He has right lower quadrant tenderness on exam.  CT abdomen pelvis significant for no acute intra-abdominal pathology however a incidental lesion that suspicious for colonic mass is found.  Radiologist recommended colonoscopy for direct visualization.  Diarrhea is likely viral in nature secondary to ingestion of food and water in [place].  I was unable to provide him with recommendations regarding if symptoms persist and additional workup/treatment if they are bacterial in nature.  Prior to informing patient of all of the results, patient reports that he is unable to wait any longer and leaves the emergency department.  I was able to review results and did not see any emergent abnormalities that would require emergent treatment or admission.  However I was unable to let him know of the incidental colonic mass found on CT.  I was unable to provide him with post ED recommendations or return to emergency department precautions to him leaving the emergency department.  After consideration of the diagnostic results and the patients response to treatment, I feel that the patent would benefit from outpatient management, follow-up with primary care provider for routine medical maintenance, GI follow-up for possible colonoscopy for suspicious lesion found on CT.   7:00 PM I attempted to call patient via his mobile telephone that was provided on chart.  However, he did not answer.  I left a voicemail regarding reviewing results on his MyChart following up with his primary care provider.         Final Clinical Impression(s) / ED Diagnoses Final diagnoses:  Chest pain, unspecified type  Diarrhea, unspecified type    Rx / DC Orders ED Discharge Orders     None         Judithann Sheen, PA 09/23/23 1901    Arby Barrette, MD 09/29/23 1337

## 2023-09-23 NOTE — ED Notes (Signed)
Patient transported to CT 

## 2023-09-23 NOTE — ED Notes (Signed)
Discharge paperwork was not reviewed with patient, Pain was under control. No prescriptions were called in, but all questions were addressed.  Pt verbalized understanding as well as all parties involved. No questions or concerns voiced at the time of discharge. No acute distress noted.   Pt ambulated out to PVA without incident or assistance.

## 2023-09-23 NOTE — ED Triage Notes (Signed)
Pt arrived POV from work. Pt caox4 reporting brief episode of left sided CP described as a stabbing pain with nausea and dry heaving when at work. Pt denies CP, SOB, N/V at present. Pt further reports he was on a cruise last week and had 3 days of diarrhea following which has subsided but has had decreased appetite since.

## 2024-01-07 ENCOUNTER — Ambulatory Visit
Admission: EM | Admit: 2024-01-07 | Discharge: 2024-01-07 | Disposition: A | Discharge status: Home / Self Care | Payer: 59 | Attending: Family Medicine | Admitting: Family Medicine

## 2024-01-07 ENCOUNTER — Other Ambulatory Visit: Payer: Self-pay

## 2024-01-07 DIAGNOSIS — R21 Rash and other nonspecific skin eruption: Secondary | ICD-10-CM | POA: Diagnosis not present

## 2024-01-07 DIAGNOSIS — L03113 Cellulitis of right upper limb: Secondary | ICD-10-CM | POA: Diagnosis not present

## 2024-01-07 MED ORDER — AMOXICILLIN-POT CLAVULANATE 875-125 MG PO TABS: 1.0000 | ORAL_TABLET | Freq: Two times a day (BID) | ORAL | 0 refills | Status: AC

## 2024-01-07 NOTE — ED Triage Notes (Signed)
Pt presents to uc with co of rash to right arm. Pt reports he got a tattoo in November. Pt then noticed spots popping up in December, pt has been using salve on them and was able to get drainage out of one of them 1 week ago.

## 2024-01-07 NOTE — Discharge Instructions (Addendum)
Advised patient to take medication as directed with food to completion.  Encouraged increase daily water intake to 64 ounces per day while taking this medication.  Advised if symptoms worsen and/or unresolved please follow-up with PCP or here for further evaluation. 

## 2024-01-07 NOTE — ED Provider Notes (Signed)
[facility]    CSN: [id number] Arrival date & time: 01/07/24  1207      History   Chief Complaint Chief Complaint  Patient presents with   Rash    HPI Raymond Stewart is a 43 y.o. male.   HPI 43 year old male presents with a rash of right arm.  Reports getting new tattoo in November 2024 then noticing spots popping up in December once shaving this area.  PMH significant for GAD and hearing loss in right ear.  Past Medical History:  Diagnosis Date   ANKLE PAIN, RIGHT 09/04/2010   Qualifier: Diagnosis of  By: Orson Aloe MD, Manus Gunning 09/04/2012    Patient Active Problem List   Diagnosis Date Noted   Orthostatic hypotension 04/21/2019   Olecranon bursitis, right elbow 09/28/2016   Olecranon bursitis of left elbow 06/26/2016   Subacromial bursitis 01/09/2016   Hearing loss in right ear 04/15/2015   GAD (generalized anxiety disorder) 08/03/2014   Primary osteoarthritis of right hip 09/08/2013   Scrotal pain 05/19/2013   Smoker 09/04/2012    Past Surgical History:  Procedure Laterality Date   JOINT REPLACEMENT     left hand  07/18/2007   right knee bone spurs  05/18/1999       Home Medications    Prior to Admission medications   Medication Sig Start Date End Date Taking? Authorizing Provider  amoxicillin-clavulanate (AUGMENTIN) 875-125 MG tablet Take 1 tablet by mouth 2 (two) times daily for 10 days. 01/07/24 01/17/24 Yes Trevor Iha, FNP  amphetamine-dextroamphetamine (ADDERALL) 10 MG tablet Take 10 mg by mouth in the morning, at noon, and at bedtime.    [provider]    Family History Family History  Problem Relation Age of Onset   Hypertension Father    Heart disease Father    Heart attack Father    Cancer Other        lung   Diabetes Other     Social History Social History   Tobacco Use   Smoking status: Every Day    Current packs/day: 0.50    Average packs/day: 0.5 packs/day for 12.0 years (6.0 ttl pk-yrs)     Types: Cigarettes   Smokeless tobacco: Never  Vaping Use   Vaping status: Former  Substance Use Topics   Alcohol use: No   Drug use: No     Allergies   Patient has no known allergies.   Review of Systems Review of Systems   Physical Exam Triage Vital Signs ED Triage Vitals  Encounter Vitals Group     BP 01/07/24 1259 127/85     Systolic BP Percentile --      Diastolic BP Percentile --      Pulse Rate 01/07/24 1259 75     Resp 01/07/24 1259 16     Temp 01/07/24 1259 98.7 F (37.1 C)     Temp src --      SpO2 01/07/24 1259 98 %     Weight --      Height --      Head Circumference --      Peak Flow --      Pain Score 01/07/24 1258 4     Pain Loc --      Pain Education --      Exclude from Growth Chart --    No data found.  Updated Vital Signs BP 127/85   Pulse 75   Temp 98.7 F (37.1 C)  Resp 16   SpO2 98%   Physical Exam Vitals and nursing note reviewed.  Constitutional:      Appearance: Normal appearance. He is normal weight.  HENT:     Head: Normocephalic and atraumatic.     Mouth/Throat:     Mouth: Mucous membranes are moist.     Pharynx: Oropharynx is clear.  Eyes:     Extraocular Movements: Extraocular movements intact.     Conjunctiva/sclera: Conjunctivae normal.     Pupils: Pupils are equal, round, and reactive to light.  Cardiovascular:     Rate and Rhythm: Normal rate and regular rhythm.     Pulses: Normal pulses.     Heart sounds: Normal heart sounds.  Pulmonary:     Effort: Pulmonary effort is normal.     Breath sounds: Normal breath sounds. No wheezing, rhonchi or rales.  Musculoskeletal:        General: Normal range of motion.     Cervical back: Normal range of motion and neck supple.  Skin:    General: Skin is warm and dry.     Comments: Right lower arm (dorsum): Erythematous, mildly indurated mildly fluctuant papules noted-please see image below  Neurological:     General: No focal deficit present.     Mental Status: He is  alert and oriented to person, place, and time. Mental status is at baseline.  Psychiatric:        Mood and Affect: Mood normal.        Behavior: Behavior normal.      UC Treatments / Results  Labs (all labs ordered are listed, but only abnormal results are displayed) Labs Reviewed - No data to display  EKG   Radiology No results found.  Procedures Procedures (including critical care time)  Medications Ordered in UC Medications - No data to display  Initial Impression / Assessment and Plan / UC Course  I have reviewed the triage vital signs and the nursing notes.  Pertinent labs & imaging results that were available during my care of the patient were reviewed by me and considered in my medical decision making (see chart for details).     MDM: 1.  Cellulitis of right arm-Rx'd Augmentin 875/125 mg tablet: Take 1 tablet twice daily x 10 days; 2.  Rash, nonspecific skin eruption-Rx'd Augmentin 875/125 mg tablet: Take 1 tablet twice daily x 10 days. Advised patient to take medication as directed with food to completion.  Encouraged increase daily water intake to 64 ounces per day while taking this medication.  Advised if symptoms worsen and/or unresolved please follow-up with PCP or here for further evaluation.  Patient discharged home, hemodynamically stable. Final Clinical Impressions(s) / UC Diagnoses   Final diagnoses:  Rash and nonspecific skin eruption  Cellulitis of right arm     Discharge Instructions      Advised patient to take medication as directed with food to completion.  Encouraged increase daily water intake to 64 ounces per day while taking this medication.  Advised if symptoms worsen and/or unresolved please follow-up with PCP or here for further evaluation.     ED Prescriptions     Medication Sig Dispense Auth. Provider   amoxicillin-clavulanate (AUGMENTIN) 875-125 MG tablet Take 1 tablet by mouth 2 (two) times daily for 10 days. 20 tablet Trevor Iha, FNP      PDMP not reviewed this encounter.   Trevor Iha, FNP 01/07/24 1358
